# Patient Record
Sex: Male | Born: 1935 | Race: Black or African American | Hispanic: No | Marital: Married | Smoking: Former smoker
Health system: Southern US, Community
[De-identification: ages and names within clinical notes are randomized; demographics above are authoritative.]

## PROBLEM LIST (undated history)

## (undated) DIAGNOSIS — E119 Type 2 diabetes mellitus without complications: Secondary | ICD-10-CM

## (undated) DIAGNOSIS — F0390 Unspecified dementia without behavioral disturbance: Secondary | ICD-10-CM

## (undated) DIAGNOSIS — N390 Urinary tract infection, site not specified: Secondary | ICD-10-CM

## (undated) DIAGNOSIS — I10 Essential (primary) hypertension: Secondary | ICD-10-CM

## (undated) DIAGNOSIS — E785 Hyperlipidemia, unspecified: Secondary | ICD-10-CM

---

## 2001-01-25 ENCOUNTER — Ambulatory Visit (HOSPITAL_COMMUNITY): Admission: RE | Admit: 2001-01-25 | Discharge: 2001-01-25 | Payer: Self-pay | Admitting: General Surgery

## 2002-09-06 ENCOUNTER — Encounter: Payer: Self-pay | Admitting: Family Medicine

## 2002-09-06 ENCOUNTER — Ambulatory Visit (HOSPITAL_COMMUNITY): Admission: RE | Admit: 2002-09-06 | Discharge: 2002-09-06 | Payer: Self-pay | Admitting: Family Medicine

## 2002-09-13 ENCOUNTER — Encounter: Payer: Self-pay | Admitting: Family Medicine

## 2002-09-13 ENCOUNTER — Ambulatory Visit (HOSPITAL_COMMUNITY): Admission: RE | Admit: 2002-09-13 | Discharge: 2002-09-13 | Payer: Self-pay | Admitting: Family Medicine

## 2004-01-10 ENCOUNTER — Observation Stay (HOSPITAL_COMMUNITY): Admission: RE | Admit: 2004-01-10 | Discharge: 2004-01-11 | Payer: Self-pay | Admitting: General Surgery

## 2004-05-14 ENCOUNTER — Ambulatory Visit (HOSPITAL_COMMUNITY): Admission: RE | Admit: 2004-05-14 | Discharge: 2004-05-14 | Payer: Self-pay | Admitting: Internal Medicine

## 2005-07-08 ENCOUNTER — Ambulatory Visit (HOSPITAL_COMMUNITY): Admission: RE | Admit: 2005-07-08 | Discharge: 2005-07-08 | Payer: Self-pay | Admitting: Family Medicine

## 2006-05-18 ENCOUNTER — Ambulatory Visit (HOSPITAL_COMMUNITY): Admission: RE | Admit: 2006-05-18 | Discharge: 2006-05-18 | Payer: Self-pay | Admitting: Family Medicine

## 2008-08-30 ENCOUNTER — Inpatient Hospital Stay (HOSPITAL_COMMUNITY): Admission: EM | Admit: 2008-08-30 | Discharge: 2008-09-03 | Payer: Self-pay | Admitting: Emergency Medicine

## 2009-01-30 ENCOUNTER — Ambulatory Visit (HOSPITAL_COMMUNITY): Admission: RE | Admit: 2009-01-30 | Discharge: 2009-01-30 | Payer: Self-pay | Admitting: Family Medicine

## 2009-02-27 ENCOUNTER — Encounter: Admission: RE | Admit: 2009-02-27 | Discharge: 2009-02-27 | Payer: Self-pay | Admitting: Neurosurgery

## 2009-03-16 ENCOUNTER — Ambulatory Visit (HOSPITAL_COMMUNITY): Admission: RE | Admit: 2009-03-16 | Discharge: 2009-03-16 | Payer: Self-pay | Admitting: Family Medicine

## 2009-03-20 ENCOUNTER — Encounter: Admission: RE | Admit: 2009-03-20 | Discharge: 2009-03-20 | Payer: Self-pay | Admitting: Neurosurgery

## 2009-03-27 ENCOUNTER — Ambulatory Visit (HOSPITAL_COMMUNITY): Admission: RE | Admit: 2009-03-27 | Discharge: 2009-03-27 | Payer: Self-pay | Admitting: Family Medicine

## 2009-04-09 ENCOUNTER — Encounter (INDEPENDENT_AMBULATORY_CARE_PROVIDER_SITE_OTHER): Payer: Self-pay | Admitting: *Deleted

## 2009-05-03 DIAGNOSIS — I10 Essential (primary) hypertension: Secondary | ICD-10-CM

## 2009-05-03 DIAGNOSIS — N400 Enlarged prostate without lower urinary tract symptoms: Secondary | ICD-10-CM

## 2009-05-03 DIAGNOSIS — K5730 Diverticulosis of large intestine without perforation or abscess without bleeding: Secondary | ICD-10-CM | POA: Insufficient documentation

## 2009-05-03 DIAGNOSIS — E7800 Pure hypercholesterolemia, unspecified: Secondary | ICD-10-CM

## 2009-05-03 DIAGNOSIS — I6350 Cerebral infarction due to unspecified occlusion or stenosis of unspecified cerebral artery: Secondary | ICD-10-CM | POA: Insufficient documentation

## 2009-05-03 DIAGNOSIS — E119 Type 2 diabetes mellitus without complications: Secondary | ICD-10-CM

## 2009-05-07 ENCOUNTER — Telehealth (INDEPENDENT_AMBULATORY_CARE_PROVIDER_SITE_OTHER): Payer: Self-pay

## 2009-05-07 ENCOUNTER — Ambulatory Visit: Payer: Self-pay | Admitting: Internal Medicine

## 2009-05-07 DIAGNOSIS — R972 Elevated prostate specific antigen [PSA]: Secondary | ICD-10-CM | POA: Insufficient documentation

## 2009-05-07 DIAGNOSIS — R634 Abnormal weight loss: Secondary | ICD-10-CM

## 2009-05-11 ENCOUNTER — Encounter: Payer: Self-pay | Admitting: Internal Medicine

## 2009-05-14 ENCOUNTER — Encounter: Payer: Self-pay | Admitting: Urgent Care

## 2009-05-14 LAB — CONVERTED CEMR LAB: Tissue Transglutaminase Ab, IgA: 0.5 units (ref <7)

## 2009-05-15 ENCOUNTER — Ambulatory Visit: Payer: Self-pay | Admitting: Gastroenterology

## 2009-06-11 ENCOUNTER — Ambulatory Visit: Payer: Self-pay | Admitting: Internal Medicine

## 2009-06-12 ENCOUNTER — Encounter: Payer: Self-pay | Admitting: Urgent Care

## 2009-06-12 DIAGNOSIS — D649 Anemia, unspecified: Secondary | ICD-10-CM

## 2009-06-14 ENCOUNTER — Telehealth (INDEPENDENT_AMBULATORY_CARE_PROVIDER_SITE_OTHER): Payer: Self-pay

## 2009-06-14 LAB — CONVERTED CEMR LAB
Basophils Absolute: 0 10*3/uL (ref 0.0–0.1)
Basophils Relative: 0 % (ref 0–1)
Eosinophils Absolute: 0.4 10*3/uL (ref 0.0–0.7)
Eosinophils Relative: 5 % (ref 0–5)
Hemoglobin: 12.5 g/dL — ABNORMAL LOW (ref 13.0–17.0)
MCHC: 31.8 g/dL (ref 30.0–36.0)
Monocytes Absolute: 0.6 10*3/uL (ref 0.1–1.0)
Neutro Abs: 3.8 10*3/uL (ref 1.7–7.7)
RDW: 13.5 % (ref 11.5–15.5)

## 2009-06-18 ENCOUNTER — Encounter: Payer: Self-pay | Admitting: Internal Medicine

## 2009-06-25 ENCOUNTER — Ambulatory Visit: Payer: Self-pay | Admitting: Internal Medicine

## 2010-08-19 ENCOUNTER — Encounter: Payer: Self-pay | Admitting: Neurosurgery

## 2010-10-24 ENCOUNTER — Emergency Department (HOSPITAL_COMMUNITY): Payer: MEDICARE

## 2010-10-24 ENCOUNTER — Emergency Department (HOSPITAL_COMMUNITY)
Admission: EM | Admit: 2010-10-24 | Discharge: 2010-10-24 | Disposition: A | Discharge status: Home / Self Care | Payer: MEDICARE | Attending: Emergency Medicine | Admitting: Emergency Medicine

## 2010-10-24 DIAGNOSIS — I6990 Unspecified sequelae of unspecified cerebrovascular disease: Secondary | ICD-10-CM | POA: Insufficient documentation

## 2010-10-24 DIAGNOSIS — Z79899 Other long term (current) drug therapy: Secondary | ICD-10-CM | POA: Insufficient documentation

## 2010-10-24 DIAGNOSIS — R350 Frequency of micturition: Secondary | ICD-10-CM | POA: Insufficient documentation

## 2010-10-24 DIAGNOSIS — R509 Fever, unspecified: Secondary | ICD-10-CM | POA: Insufficient documentation

## 2010-10-24 DIAGNOSIS — N390 Urinary tract infection, site not specified: Secondary | ICD-10-CM | POA: Insufficient documentation

## 2010-10-24 DIAGNOSIS — R300 Dysuria: Secondary | ICD-10-CM | POA: Insufficient documentation

## 2010-10-24 LAB — DIFFERENTIAL
Basophils Relative: 0 % (ref 0–1)
Eosinophils Absolute: 0.1 10*3/uL (ref 0.0–0.7)
Eosinophils Relative: 1 % (ref 0–5)
Monocytes Absolute: 1.2 10*3/uL — ABNORMAL HIGH (ref 0.1–1.0)
Monocytes Relative: 10 % (ref 3–12)
Neutrophils Relative %: 80 % — ABNORMAL HIGH (ref 43–77)

## 2010-10-24 LAB — URINALYSIS, ROUTINE W REFLEX MICROSCOPIC
Bilirubin Urine: NEGATIVE
Nitrite: NEGATIVE
Protein, ur: 100 mg/dL — AB

## 2010-10-24 LAB — CBC
MCH: 28.6 pg (ref 26.0–34.0)
MCHC: 33.3 g/dL (ref 30.0–36.0)
Platelets: 232 10*3/uL (ref 150–400)

## 2010-10-24 LAB — COMPREHENSIVE METABOLIC PANEL
ALT: 15 U/L (ref 0–53)
Albumin: 3.8 g/dL (ref 3.5–5.2)
Alkaline Phosphatase: 65 U/L (ref 39–117)
BUN: 16 mg/dL (ref 6–23)
Chloride: 99 mEq/L (ref 96–112)
Glucose, Bld: 141 mg/dL — ABNORMAL HIGH (ref 70–99)
Potassium: 3.7 mEq/L (ref 3.5–5.1)
Sodium: 136 mEq/L (ref 135–145)
Total Bilirubin: 1 mg/dL (ref 0.3–1.2)

## 2010-10-24 LAB — URINE MICROSCOPIC-ADD ON

## 2010-10-27 LAB — URINE CULTURE

## 2010-11-12 LAB — CBC
HCT: 36.5 % — ABNORMAL LOW (ref 39.0–52.0)
HCT: 36.9 % — ABNORMAL LOW (ref 39.0–52.0)
HCT: 39.7 % (ref 39.0–52.0)
Hemoglobin: 12 g/dL — ABNORMAL LOW (ref 13.0–17.0)
Hemoglobin: 13.3 g/dL (ref 13.0–17.0)
MCHC: 33 g/dL (ref 30.0–36.0)
MCHC: 33.2 g/dL (ref 30.0–36.0)
MCV: 87.4 fL (ref 78.0–100.0)
MCV: 87.8 fL (ref 78.0–100.0)
MCV: 87.8 fL (ref 78.0–100.0)
Platelets: 189 10*3/uL (ref 150–400)
Platelets: 209 10*3/uL (ref 150–400)
Platelets: 233 10*3/uL (ref 150–400)
RBC: 4.17 MIL/uL — ABNORMAL LOW (ref 4.22–5.81)
RBC: 4.58 MIL/uL (ref 4.22–5.81)
RDW: 14.1 % (ref 11.5–15.5)
RDW: 14.2 % (ref 11.5–15.5)
RDW: 14.2 % (ref 11.5–15.5)
WBC: 20.3 10*3/uL — ABNORMAL HIGH (ref 4.0–10.5)
WBC: 20.6 10*3/uL — ABNORMAL HIGH (ref 4.0–10.5)
WBC: 25.4 10*3/uL — ABNORMAL HIGH (ref 4.0–10.5)

## 2010-11-12 LAB — GLUCOSE, CAPILLARY
Glucose-Capillary: 119 mg/dL — ABNORMAL HIGH (ref 70–99)
Glucose-Capillary: 121 mg/dL — ABNORMAL HIGH (ref 70–99)
Glucose-Capillary: 126 mg/dL — ABNORMAL HIGH (ref 70–99)
Glucose-Capillary: 127 mg/dL — ABNORMAL HIGH (ref 70–99)
Glucose-Capillary: 151 mg/dL — ABNORMAL HIGH (ref 70–99)
Glucose-Capillary: 153 mg/dL — ABNORMAL HIGH (ref 70–99)
Glucose-Capillary: 161 mg/dL — ABNORMAL HIGH (ref 70–99)
Glucose-Capillary: 170 mg/dL — ABNORMAL HIGH (ref 70–99)
Glucose-Capillary: 198 mg/dL — ABNORMAL HIGH (ref 70–99)

## 2010-11-12 LAB — DIFFERENTIAL
Basophils Absolute: 0 10*3/uL (ref 0.0–0.1)
Basophils Absolute: 0 10*3/uL (ref 0.0–0.1)
Basophils Absolute: 0 10*3/uL (ref 0.0–0.1)
Basophils Relative: 0 % (ref 0–1)
Basophils Relative: 0 % (ref 0–1)
Basophils Relative: 0 % (ref 0–1)
Eosinophils Absolute: 0 10*3/uL (ref 0.0–0.7)
Eosinophils Absolute: 0 10*3/uL (ref 0.0–0.7)
Eosinophils Relative: 0 % (ref 0–5)
Eosinophils Relative: 0 % (ref 0–5)
Eosinophils Relative: 0 % (ref 0–5)
Eosinophils Relative: 0 % (ref 0–5)
Lymphocytes Relative: 1 % — ABNORMAL LOW (ref 12–46)
Lymphocytes Relative: 3 % — ABNORMAL LOW (ref 12–46)
Lymphs Abs: 0.2 10*3/uL — ABNORMAL LOW (ref 0.7–4.0)
Lymphs Abs: 0.6 10*3/uL — ABNORMAL LOW (ref 0.7–4.0)
Lymphs Abs: 1 10*3/uL (ref 0.7–4.0)
Monocytes Absolute: 0.7 10*3/uL (ref 0.1–1.0)
Monocytes Absolute: 1 10*3/uL (ref 0.1–1.0)
Monocytes Absolute: 1.1 10*3/uL — ABNORMAL HIGH (ref 0.1–1.0)
Monocytes Relative: 4 % (ref 3–12)
Myelocytes: 0 %
Neutro Abs: 18.3 10*3/uL — ABNORMAL HIGH (ref 1.7–7.7)
Neutro Abs: 19.3 10*3/uL — ABNORMAL HIGH (ref 1.7–7.7)
Neutro Abs: 7 10*3/uL (ref 1.7–7.7)
Neutrophils Relative %: 71 % (ref 43–77)
Neutrophils Relative %: 89 % — ABNORMAL HIGH (ref 43–77)
Neutrophils Relative %: 94 % — ABNORMAL HIGH (ref 43–77)
Promyelocytes Absolute: 0 %
WBC Morphology: INCREASED
nRBC: 0 /100 WBC

## 2010-11-12 LAB — URINALYSIS, ROUTINE W REFLEX MICROSCOPIC
Bilirubin Urine: NEGATIVE
Glucose, UA: NEGATIVE mg/dL
Ketones, ur: NEGATIVE mg/dL
Nitrite: NEGATIVE
Specific Gravity, Urine: 1.01 (ref 1.005–1.030)
pH: 5.5 (ref 5.0–8.0)

## 2010-11-12 LAB — BASIC METABOLIC PANEL
BUN: 14 mg/dL (ref 6–23)
BUN: 15 mg/dL (ref 6–23)
BUN: 21 mg/dL (ref 6–23)
CO2: 25 mEq/L (ref 19–32)
CO2: 26 mEq/L (ref 19–32)
Calcium: 8.1 mg/dL — ABNORMAL LOW (ref 8.4–10.5)
Calcium: 8.2 mg/dL — ABNORMAL LOW (ref 8.4–10.5)
Calcium: 9.1 mg/dL (ref 8.4–10.5)
Chloride: 105 mEq/L (ref 96–112)
Chloride: 107 mEq/L (ref 96–112)
Chloride: 99 mEq/L (ref 96–112)
Creatinine, Ser: 1.14 mg/dL (ref 0.4–1.5)
Creatinine, Ser: 1.22 mg/dL (ref 0.4–1.5)
Creatinine, Ser: 1.26 mg/dL (ref 0.4–1.5)
Creatinine, Ser: 1.32 mg/dL (ref 0.4–1.5)
GFR calc Af Amer: >60 mL/min (ref >60)
GFR calc Af Amer: >60 mL/min (ref >60)
GFR calc Af Amer: >60 mL/min (ref >60)
GFR calc non Af Amer: >60 mL/min (ref >60)
Glucose, Bld: 112 mg/dL — ABNORMAL HIGH (ref 70–99)
Glucose, Bld: 142 mg/dL — ABNORMAL HIGH (ref 70–99)
Glucose, Bld: 211 mg/dL — ABNORMAL HIGH (ref 70–99)
Potassium: 3.4 mEq/L — ABNORMAL LOW (ref 3.5–5.1)
Potassium: 3.5 mEq/L (ref 3.5–5.1)
Potassium: 3.5 mEq/L (ref 3.5–5.1)
Sodium: 139 mEq/L (ref 135–145)

## 2010-11-12 LAB — URINE CULTURE
Colony Count: >=100000
Colony Count: >=100000
Special Requests: NEGATIVE

## 2010-11-12 LAB — URINE MICROSCOPIC-ADD ON

## 2010-11-12 LAB — DIGOXIN LEVEL: Digoxin Level: 0.3 ng/mL — ABNORMAL LOW (ref 0.8–2.0)

## 2010-11-12 LAB — TSH: TSH: 0.954 u[IU]/mL (ref 0.350–4.500)

## 2010-12-10 NOTE — Discharge Summary (Signed)
NAME:  Richard Hays, MACMASTER NO.:  [id number]   MEDICAL RECORD NO.:  [id number]          PATIENT TYPE:  INP   LOCATION:  A304                          FACILITY:  [facility]   PHYSICIAN:  Dorris Singh, DO    DATE OF BIRTH:  12-31-1925   DATE OF ADMISSION:  08/30/2008  DATE OF DISCHARGE:  02/07/2010LH                               DISCHARGE SUMMARY   ADMISSION DIAGNOSES:  1. Altered mental status.  2. Weakness.  3. Urinary tract infection.  4. Tachycardia.   DISCHARGE DIAGNOSES:  1. Altered mental status which is resolved.  2. Weakness which is improved.  3. Urinary tract infection which is resolving.  4. Tachycardia which is resolved.  5. Diabetes.  6. History of cerebrovascular accident.   HOSPITAL CONSULTATIONS:  None.   PRIMARY CARE PHYSICIAN:  Dr. Nobie Putnam.   His H and P was done by Dr. Lilian Kapur.  To summarize, the patient is an  77 year old African American male who presented with some mental status  changes and weakness.  He was found to have a UTI.  He was admitted to  the service of [facility].  He was started on IV Cipro.  Urine cultures  were obtained and the patient was placed on all of his home medications  and DVT and GI prophylaxis was initiated.  While the patient was in the  emergency room he did receive some IV Cardizem which improved his  tachycardia.  He was started on IV fluids as well.  While he was here he  also continued to have episodes of pyrexia which was treated with NSAIDs  but clinically he continued to improve.  On February 6 his urine  cultures grew out pseudomonas.  He was started initially on Cipro and  then Zosyn was added due to the patient's worsening condition as far as  multiple episodes of pyrexia.  Once his cultures came back which showed  Pseudomonas aeruginosa.  The Zosyn was discontinued.  His white count  continued to trend down and today the patient is stable and so will be  discharged home with continued p.o. antibiotic  therapy.  His vital signs  are as follows:   PHYSICAL EXAMINATION:  VITAL SIGNS:  Temperature 99, pulse 79,  respirations 20, blood pressure 140/77.  GENERAL:  The patient is an 77 year old African American male who is  well-developed and well-nourished and in no acute distress.  HEART:  Regular rate and rhythm.  CHEST:  Clear to auscultation bilaterally.  ABDOMEN:  Soft, nontender, nondistended.  EXTREMITIES:  Positive pulses.   White count is 9.8, hemoglobin 12.4, hematocrit 37.4, platelet count of  233.  Sodium 135, potassium 4.0, chloride 102, CO2 25, glucose 112, BUN  15, creatinine 1.14.   DISCHARGE MEDICATIONS:  1. Medications that he will go home on include fish oil daily.  2. Lipitor 40 mg p.o. daily.  3. Glucovance 5/500 twice a day.  4. Uroxatral 10 mg daily.  5. Aspirin 325 daily.  6. Digoxin 0.25 mg.  7. New medication will be Cipro 500 one p.o. x3 days and this will  give him a total course of 10 days of being on Cipro.   DISCHARGE INSTRUCTIONS:  He is to follow up with his primary care doctor  in 1-3 days.  He is to increase his activity ________.  He has no  restrictions on his diet.  However, he is to increase his diet slowly as  tolerated.  He is to continue on all medications as directed, encourage  fluids and if he does have episodes of fever he is to take NSAIDs as  needed.  The patient's condition is stable and his disposition is to  home.      Dorris Singh, DO  Electronically Signed     CB/MEDQ  D:  09/03/2008  T:  09/03/2008  Job:  [id number]   cc:   Dr Nobie Putnam

## 2010-12-10 NOTE — H&P (Signed)
NAME:  Richard Hays, Richard Hays NO.:  [id number]   MEDICAL RECORD NO.:  [id number]          PATIENT TYPE:  INP   LOCATION:  A304                          FACILITY:  [facility]   PHYSICIAN:  Skeet Latch, DO    DATE OF BIRTH:  06-09-1926   DATE OF ADMISSION:  08/30/2008  DATE OF DISCHARGE:  LH                              HISTORY & PHYSICAL   PRIMARY CARE PHYSICIAN:  Cres[place].   CHIEF COMPLAINT:  Altered mental status and weakness.   HISTORY OF PRESENT ILLNESS:  This is an 77 year old African-American  male who presents with some mild mental status changes and weakness.  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  He was working with a Radio broadcast assistant. His coworker  noticed he did not get out of his [employer] Niece they were riding in.  They went  back in to see if there were any problems, and the patient was confused  and had some incontinence of his urine.  They had difficulty getting him  back into the flower store, and EMS was called.  EMS arrived, noticed  the patient was confused and had unsteady gait.  The patient stated he  did not feel well, and according to family members he had not been  feeling well over the last few days.  Wife states he has a poor  appetite.  No fever, chills, abdominal pain, chest pain, nausea, or  vomiting.  The patient presented to the ER. He was slightly  tachycardiac.  He has been receiving fluid boluses also.  The patient  was found to have a urinary tract infection and was given and  antibiotics and at this time feels better and seems to be improving.   PAST MEDICAL HISTORY:  Includes CVA and TIAs and irregular heartbeat   SURGICAL HISTORY:  Hernia.   SOCIAL HISTORY:  No history of smoking, alcoholism, or illicit drug use.   FAMILY HISTORY:  Unremarkable.   ALLERGIES:  No known drug allergies.   HOME MEDICATIONS:  Include Glucovance 5/500 mg twice daily, Uroxatral 10  mg once a day, aspirin 325 mg  daily, digoxin 0.25 mg once a day, Lipitor  40 mg 1 tablet p.o. daily.   REVIEW OF SYSTEMS:  CONSTITUTIONAL:  Decreased appetite. No fever,  chills, weight gain, weight loss.  HEENT: Unremarkable.  CARDIOVASCULAR:  Unremarkable.  RESPIRATORY:  Unremarkable.  GASTROINTESTINAL:  Unremarkable.  GENITOURINARY:  Unremarkable.  MUSCULOSKELETAL:  Positive  for some myalgias.  NEUROLOGIC:  Positive for some right leg weakness.   PHYSICAL EXAM:  VITAL SIGNS: Temperature is 99.6, respiratory rate 36,  pulse 84, blood pressure 127/66.  CONSTITUTIONAL: He is well developed, well nourished, well hydrated, in  no acute distress.  He appears current age.  HEENT: Head is atraumatic, normocephalic.  Eyes: PERRLA.  EOMI.  NECK: Soft, supple, nontender, nondistended.  CARDIOVASCULAR:  Positive for murmur. No rubs, gallops.  Regular rate  and rhythm.  RESPIRATORY:  Lungs are clear to auscultation bilaterally.  No rales,  rhonchi or wheezing.  ABDOMEN: Soft, nontender, nondistended.  Positive  bowel sounds.  No  rigidity or guarding.  EXTREMITIES: No clubbing, cyanosis or edema.  NEUROLOGIC:  Cranial nerves II-XII are grossly intact.  The patient  moves all extremities.  He does have some weakness on the right lower  extremity.  SKIN:  Color is normal.  No petechiae, no rash or moles.  PSYCHIATRIC:  Normal mood.  Normal affect.   LABS:  Microscopic urine few bacteria, WBCs too numerous to count, RBCs,  3-6.  Urinalysis hazy in appearance, moderate hemoglobin, trace protein,  negative nitrites, small leukocyte esterase.  Sodium 139, potassium 3.5,  chloride 103, CO2 of 24, glucose 142, BUN 18, creatinine 1.08, calcium  9.1.  White count 20,300, hemoglobin 13.3, hematocrit 39.7, platelet  count 224,000.  Chest x-ray: No acute or significant findings.   ASSESSMENT:  This is an 77 year old African-American male who presented  with some mental status changes and weakness.  The patient was found to  have a  urinary tract infection upon being evaluated in the emergency  room.  The patient is also tachycardiac and is receiving IV fluid  boluses and seems to be improving.   PLAN:  1. The patient was admitted to the service of [facility] to telemetry      unit.  2. For his urinary tract infection the patient will placed on IV      antibiotics.  Will get some accurate I's and O's.  Will continue the patient with IV fluid hydration at this time.  1. For his tachycardia  the patient did receive some IV Cardizem in      the emergency room and this seems to be improving his tachycardia.      The patient will be kept on his digoxin current dose. Will get a      digoxin level in the a.m.  2. For his diabetes the patient will placed on his home Glucovance.      Will get a hemoglobin A1c with his morning labs.  The patient will      be  placed on sliding scale, and his blood sugars will be checked      a.c. and h.s.  3. The patient will be placed on DVT as well as GI prophylaxis.      Anticipate the patient being discharged within the next 24-48 hours      if he continues to improve.      Skeet Latch, DO  Electronically Signed     SM/MEDQ  D:  08/30/2008  T:  08/31/2008  Job:  [id number]   cc:   Patrica Duel, M.D.  Fax: [phone]

## 2010-12-13 NOTE — Op Note (Signed)
NAME:  Richard Hays, Richard Hays NO.:  [id number]   MEDICAL RECORD NO.:  [id number]                   PATIENT TYPE:  AMB   LOCATION:  DAY                                  FACILITY:  [facility]   PHYSICIAN:  Barbaraann Barthel, M.D.              DATE OF BIRTH:  14-Oct-1925   DATE OF PROCEDURE:  01/10/2004  DATE OF DISCHARGE:                                 OPERATIVE REPORT   PREOPERATIVE DIAGNOSIS:  Left inguinal hernia.   POSTOPERATIVE DIAGNOSIS:  Left inguinal hernia.   OPERATION PERFORMED:  Left inguinal herniorrhaphy (modified McVay repair).   SURGEON:  Barbaraann Barthel, M.D.   SPECIMENS:  1. Properitoneal lipomatous tissue.  2. Hernia sac.   ANESTHESIA:   INDICATIONS FOR PROCEDURE:  The patient is a 77 year old black male who  presented to the medical service with signs and symptoms of left inguinal  hernia and was referred for surgery.  In essence, he had a palpable left  inguinal mass consistent with a nonincarcerated moderate sized left inguinal  hernia.  We discussed elective procedure with him, discussed the possible  use of mesh and discussed complications not limited to but including  bleeding, infection and recurrence.  Informed consent was obtained.   OPERATIVE FINDINGS:  The patient had a fairly scarred up hernia sac and a  moderate-sized indirect hernia and some weakness of the inguinal floor.  No  other abnormalities were encountered.   DESCRIPTION OF PROCEDURE:  The patient was placed in supine position after  the adequate administration of spinal anesthesia, a Foley catheter was  aseptically inserted.  The patient was prepped with Betadine solution and  draped in the usual manner.  Incision was carried out between the anterior  superior iliac spine and the pubic tubercle through skin and subcutaneous  tissue down through Scarpa's layer, opening the external oblique through the  external ring.  The ilioinguinal nerve was preserved from  dissection.  The  cord structures were dissected free from the hernia sac.  The hernia sac was  doubly ligated under direct vision.  Lipomatous properitoneal fat was also  clamped, ligated and removed and after this was done, we then sutured the  transversus abdominis and transversalis fascia to Cooper's ligament and  Poupart's ligament using interrupted 2-0 Bralon sutures.  A relaxing  incision was carried out prior to cinching these tightly.  Approximately 9  mL of 0.5% Sensorcaine was used to help with postoperative comfort around  the operative site.  We then irrigated with normal saline solution, returned  the cord structures and the ilioinguinal nerve to its anatomical position  and then closed the external oblique over the cord structures with a  running 3-0 Polysorb suture.  The subcutaneous was irrigated.  The skin was  closed with a stapling device.  Prior to closure all sponge, needle and  instrument counts were found to be correct.  The estimated blood loss  was  minimal.  The patient received 1200 mL of crystalloids intraoperatively.  There were no complications.      ___________________________________________                                            Barbaraann Barthel, M.D.   WB/MEDQ  D:  01/10/2004  T:  01/10/2004  Job:  [id number]   cc:   Patrica Duel, M.D.  [address]  [place]  [place] [address]  Fax: [phone]

## 2010-12-13 NOTE — Op Note (Signed)
NAME:  Richard Hays, PICOU NO.:  [id number]   MEDICAL RECORD NO.:  [id number]          PATIENT TYPE:  OBV   LOCATION:  A320                          FACILITY:  [facility]   PHYSICIAN:  R. Roetta Sessions, M.D. DATE OF BIRTH:  1926/04/11   DATE OF PROCEDURE:  05/14/2004  DATE OF DISCHARGE:  01/11/2004                                 OPERATIVE REPORT   PROCEDURE:  Diagnostic colonoscopy.   INDICATIONS:  The patient is a 77 year old gentleman with recent weight  loss.  He is devoid of any GI tract symptoms.  He was found to be Hemoccult-  positive recently.  Colonoscopy is now being done.  This approach has been  discussed with the patient at length.  The potential risks, benefits, and  alternatives have been reviewed, questions answered.  Please see my dictated  office note from April 19, 2004.  I believe he is low-risk for conscious  sedation.  Airway assessed.   PROCEDURE NOTE:  O2 saturation, blood pressure, pulse, and respirations were  monitored throughout the entire procedure.   CONSCIOUS SEDATION:  Versed 3 mg IV, Demerol 75 mg IV in divided doses.   INSTRUMENT USED:  Olympus video chip system.   FINDINGS:  Digital rectal exam revealed no abnormalities.   ENDOSCOPIC FINDINGS:  The prep was marginal.   Rectum:  Examination of the rectal mucosa including retroflexed view of the  anal verge revealed no abnormalities.   Colon:  The colonic mucosa was surveyed from the rectosigmoid junction  through the left, transverse, right colon to the area of the appendiceal  orifice and ileocecal valve.  These structures were well-seen and  photographed for the record.  From this level the scope was slowly  withdrawn.  All previously-mentioned mucosal surfaces were again seen.  The  patient had a few scattered left-sided diverticula.  The remainder of the  colonic mucosa appeared normal.  There was a thin coating of tenacious stool  throughout the lining of the colon, which  had to be washed copiously to get  adequate visualization.  A very small polyp or AVM may have not been seen  today because of the marginal prep; however, no gross polyp or neoplastic  process was seen.  The patient tolerated the procedure well, was reacted in  endoscopy.   IMPRESSION:  1.  Normal rectum.  2.  A few scattered left-sided diverticula.  3.  The remainder of the colonic mucosa appeared normal.  Marginal prep made      the exam more difficult, though these findings are reassuring.   RECOMMENDATIONS:  1.  CBC today.  2.  Will see the patient back in four weeks for weight check and see how he      is doing and go from there.     Otelia Sergeant   RMR/MEDQ  D:  05/14/2004  T:  05/14/2004  Job:  [id number]   cc:   Patrica Duel, M.D.  [address]  [place]  [place] [address]  Fax: [phone]

## 2013-12-27 ENCOUNTER — Encounter: Payer: Self-pay | Admitting: Internal Medicine

## 2014-03-20 ENCOUNTER — Emergency Department (HOSPITAL_COMMUNITY): Payer: Medicare Other

## 2014-03-20 ENCOUNTER — Encounter (HOSPITAL_COMMUNITY): Payer: Self-pay | Admitting: Emergency Medicine

## 2014-03-20 ENCOUNTER — Emergency Department (HOSPITAL_COMMUNITY)
Admission: EM | Admit: 2014-03-20 | Discharge: 2014-03-20 | Disposition: A | Discharge status: Home / Self Care | Payer: Medicare Other | Attending: Emergency Medicine | Admitting: Emergency Medicine

## 2014-03-20 DIAGNOSIS — R4182 Altered mental status, unspecified: Secondary | ICD-10-CM | POA: Diagnosis present

## 2014-03-20 DIAGNOSIS — F0390 Unspecified dementia without behavioral disturbance: Secondary | ICD-10-CM | POA: Diagnosis not present

## 2014-03-20 DIAGNOSIS — Z7982 Long term (current) use of aspirin: Secondary | ICD-10-CM | POA: Diagnosis not present

## 2014-03-20 DIAGNOSIS — I10 Essential (primary) hypertension: Secondary | ICD-10-CM | POA: Insufficient documentation

## 2014-03-20 DIAGNOSIS — F29 Unspecified psychosis not due to a substance or known physiological condition: Secondary | ICD-10-CM | POA: Insufficient documentation

## 2014-03-20 DIAGNOSIS — E119 Type 2 diabetes mellitus without complications: Secondary | ICD-10-CM | POA: Insufficient documentation

## 2014-03-20 DIAGNOSIS — N390 Urinary tract infection, site not specified: Secondary | ICD-10-CM | POA: Diagnosis not present

## 2014-03-20 DIAGNOSIS — Z87891 Personal history of nicotine dependence: Secondary | ICD-10-CM | POA: Diagnosis not present

## 2014-03-20 DIAGNOSIS — Z79899 Other long term (current) drug therapy: Secondary | ICD-10-CM | POA: Insufficient documentation

## 2014-03-20 HISTORY — DX: Unspecified dementia without behavioral disturbance: F03.90

## 2014-03-20 HISTORY — DX: Type 2 diabetes mellitus without complications: E11.9

## 2014-03-20 HISTORY — DX: Urinary tract infection, site not specified: N39.0

## 2014-03-20 HISTORY — DX: Essential (primary) hypertension: I10

## 2014-03-20 LAB — URINALYSIS, ROUTINE W REFLEX MICROSCOPIC
BILIRUBIN URINE: NEGATIVE
Glucose, UA: NEGATIVE mg/dL
Hgb urine dipstick: NEGATIVE
Ketones, ur: NEGATIVE mg/dL
Leukocytes, UA: NEGATIVE
NITRITE: NEGATIVE
Protein, ur: 30 mg/dL — AB
Specific Gravity, Urine: 1.025 (ref 1.005–1.030)
Urobilinogen, UA: 0.2 mg/dL (ref 0.0–1.0)
pH: 5.5 (ref 5.0–8.0)

## 2014-03-20 LAB — CBC WITH DIFFERENTIAL/PLATELET
Basophils Absolute: 0 10*3/uL (ref 0.0–0.1)
Basophils Relative: 0 % (ref 0–1)
Eosinophils Absolute: 0.8 10*3/uL — ABNORMAL HIGH (ref 0.0–0.7)
Eosinophils Relative: 12 % — ABNORMAL HIGH (ref 0–5)
HCT: 40.2 % (ref 39.0–52.0)
HEMOGLOBIN: 13.3 g/dL (ref 13.0–17.0)
LYMPHS ABS: 1.8 10*3/uL (ref 0.7–4.0)
LYMPHS PCT: 26 % (ref 12–46)
MCH: 27.9 pg (ref 26.0–34.0)
MCHC: 33.1 g/dL (ref 30.0–36.0)
MCV: 84.5 fL (ref 78.0–100.0)
MONOS PCT: 8 % (ref 3–12)
Monocytes Absolute: 0.6 10*3/uL (ref 0.1–1.0)
NEUTROS ABS: 3.7 10*3/uL (ref 1.7–7.7)
NEUTROS PCT: 54 % (ref 43–77)
Platelets: 219 10*3/uL (ref 150–400)
RBC: 4.76 MIL/uL (ref 4.22–5.81)
RDW: 14.1 % (ref 11.5–15.5)
WBC: 6.9 10*3/uL (ref 4.0–10.5)

## 2014-03-20 LAB — COMPREHENSIVE METABOLIC PANEL
ALK PHOS: 88 U/L (ref 39–117)
ALT: 16 U/L (ref 0–53)
ANION GAP: 14 (ref 5–15)
AST: 22 U/L (ref 0–37)
Albumin: 4.1 g/dL (ref 3.5–5.2)
BUN: 24 mg/dL — AB (ref 6–23)
CHLORIDE: 106 meq/L (ref 96–112)
CO2: 23 meq/L (ref 19–32)
Calcium: 9.3 mg/dL (ref 8.4–10.5)
Creatinine, Ser: 1.47 mg/dL — ABNORMAL HIGH (ref 0.50–1.35)
GFR, EST AFRICAN AMERICAN: 48 mL/min — AB (ref >90)
GFR, EST NON AFRICAN AMERICAN: 41 mL/min — AB (ref >90)
GLUCOSE: 128 mg/dL — AB (ref 70–99)
POTASSIUM: 4.6 meq/L (ref 3.7–5.3)
Sodium: 143 mEq/L (ref 137–147)
Total Bilirubin: 0.4 mg/dL (ref 0.3–1.2)
Total Protein: 7.9 g/dL (ref 6.0–8.3)

## 2014-03-20 LAB — URINE MICROSCOPIC-ADD ON

## 2014-03-20 MED ORDER — CIPROFLOXACIN HCL 250 MG PO TABS
500.0000 mg | ORAL_TABLET | Freq: Once | ORAL | Status: AC
  Administered 2014-03-20: 500 mg via ORAL
  Filled 2014-03-20: qty 2

## 2014-03-20 MED ORDER — CIPROFLOXACIN HCL 500 MG PO TABS: 500.0000 mg | ORAL_TABLET | Freq: Two times a day (BID) | ORAL | Status: DC

## 2014-03-20 NOTE — Discharge Instructions (Signed)
Follow up with your md in 3-4 days °

## 2014-03-20 NOTE — ED Provider Notes (Signed)
CSN: [id number]     Arrival date & time 03/20/14  1658 History   First MD Initiated Contact with Patient 03/20/14 1832     Chief Complaint  Patient presents with  . Altered Mental Status     (Consider location/radiation/quality/duration/timing/severity/associated sxs/prior Treatment) Patient is a 80 y.o. male presenting with altered mental status. The history is provided by the patient (pt has had some confusion lately and the family states usually this is from a uti).  Altered Mental Status Presenting symptoms: confusion   Severity:  Mild Most recent episode:  2 days ago Timing:  Constant Progression:  Unchanged Chronicity:  Recurrent Context: dementia   Associated symptoms: no abdominal pain, no hallucinations, no headaches, no rash and no seizures     Past Medical History  Diagnosis Date  . Dementia   . Diabetes mellitus without complication   . Hypertension   . UTI (lower urinary tract infection)    History reviewed. No pertinent past surgical history. History reviewed. No pertinent family history. History  Substance Use Topics  . Smoking status: Former Games developer  . Smokeless tobacco: Not on file  . Alcohol Use: No    Review of Systems  Constitutional: Negative for appetite change and fatigue.  HENT: Negative for congestion, ear discharge and sinus pressure.   Eyes: Negative for discharge.  Respiratory: Negative for cough.   Cardiovascular: Negative for chest pain.  Gastrointestinal: Negative for abdominal pain and diarrhea.  Genitourinary: Negative for frequency and hematuria.  Musculoskeletal: Negative for back pain.  Skin: Negative for rash.  Neurological: Negative for seizures and headaches.  Psychiatric/Behavioral: Positive for confusion. Negative for hallucinations.      Allergies  Review of patient's allergies indicates no known allergies.  Home Medications   Prior to Admission medications   Medication Sig Start Date End Date Taking? Authorizing  Provider  aspirin EC 325 MG tablet Take 325 mg by mouth every morning.   Yes Historical Provider, MD  atorvastatin (LIPITOR) 40 MG tablet Take 40 mg by mouth at bedtime. 02/18/14  Yes Historical Provider, MD  DIGOX 250 MCG tablet Take 250 mcg by mouth daily. 02/18/14  Yes Historical Provider, MD  donepezil (ARICEPT) 10 MG tablet Take 10 mg by mouth at bedtime. 02/18/14  Yes Historical Provider, MD  lisinopril (PRINIVIL,ZESTRIL) 5 MG tablet Take 5 mg by mouth daily. 03/02/14  Yes Historical Provider, MD  metFORMIN (GLUCOPHAGE) 500 MG tablet Take 250 mg by mouth 2 (two) times daily.   Yes Historical Provider, MD  NAMENDA 5 MG tablet Take 5 mg by mouth 2 (two) times daily. 01/25/14  Yes Historical Provider, MD  ciprofloxacin (CIPRO) 500 MG tablet Take 1 tablet (500 mg total) by mouth 2 (two) times daily. One po bid x 7 days 03/20/14   Benny Lennert, MD   BP 151/90  Pulse 91  Temp(Src) 99.4 F (37.4 C) (Oral)  Resp 18  Ht  (1.702 m)  Wt 142 lb (64.411 kg)  BMI 22.24 kg/m2  SpO2 100% Physical Exam  Constitutional: He is oriented to person, place, and time. He appears well-developed.  HENT:  Head: Normocephalic.  Eyes: Conjunctivae and EOM are normal. No scleral icterus.  Neck: Neck supple. No thyromegaly present.  Cardiovascular: Normal rate and regular rhythm.  Exam reveals no gallop and no friction rub.   No murmur heard. Pulmonary/Chest: No stridor. He has no wheezes. He has no rales. He exhibits no tenderness.  Abdominal: He exhibits no distension. There is no tenderness.  There is no rebound.  Musculoskeletal: Normal range of motion. He exhibits no edema.  Lymphadenopathy:    He has no cervical adenopathy.  Neurological: He is oriented to person, place, and time. He exhibits normal muscle tone. Coordination normal.  Skin: No rash noted. No erythema.  Psychiatric: He has a normal mood and affect. His behavior is normal.    ED Course  Procedures (including critical care time) Labs  Review Labs Reviewed  CBC WITH DIFFERENTIAL - Abnormal; Notable for the following:    Eosinophils Relative 12 (*)    Eosinophils Absolute 0.8 (*)    All other components within normal limits  COMPREHENSIVE METABOLIC PANEL - Abnormal; Notable for the following:    Glucose, Bld 128 (*)    BUN 24 (*)    Creatinine, Ser 1.47 (*)    GFR calc non Af Amer 41 (*)    GFR calc Af Amer 48 (*)    All other components within normal limits  URINALYSIS, ROUTINE W REFLEX MICROSCOPIC - Abnormal; Notable for the following:    Protein, ur 30 (*)    All other components within normal limits  URINE MICROSCOPIC-ADD ON - Abnormal; Notable for the following:    Squamous Epithelial / LPF FEW (*)    All other components within normal limits    Imaging Review Ct Head Wo Contrast  03/20/2014   CLINICAL DATA:  Incontinence for 1 day, confusion  EXAM: CT HEAD WITHOUT CONTRAST  TECHNIQUE: Contiguous axial images were obtained from the base of the skull through the vertex without intravenous contrast.  COMPARISON:  None.  FINDINGS: Moderate diffuse age-related atrophy. Mild low attenuation in the deep white matter. No evidence of focal abnormality to suggest mass hemorrhage infarct or extra-axial fluid. No hydrocephalus. Calvarium is intact.  IMPRESSION: No acute findings.   Electronically Signed   By: Esperanza Heir M.D.   On: 03/20/2014 20:39   Dg Chest Portable 1 View  03/20/2014   CLINICAL DATA:  Altered mental status.  Ex-smoker.  Hypertension.  EXAM: PORTABLE CHEST - 1 VIEW  COMPARISON:  10/24/2010.  FINDINGS: The cardiac silhouette remains borderline enlarged. Clear lungs with normal vascularity. Bilateral AC joint degenerative changes. Thoracic spine degenerative changes.  IMPRESSION: No acute abnormality.  Stable borderline cardiomegaly.   Electronically Signed   By: Gordan Payment M.D.   On: 03/20/2014 19:07     EKG Interpretation None      MDM   Final diagnoses:  UTI (lower urinary tract infection)     uti    Benny Lennert, MD 03/20/14 2114

## 2014-03-20 NOTE — ED Notes (Signed)
Patient given discharge instruction, verbalized understand. IV removed, band aid applied. Patient in wheelchair out of the department.  

## 2014-03-20 NOTE — ED Notes (Signed)
Pt with incont of urine x 2 today, per wife pt has been confused last night

## 2014-06-05 ENCOUNTER — Other Ambulatory Visit (HOSPITAL_COMMUNITY): Payer: Self-pay | Admitting: Family Medicine

## 2014-06-05 ENCOUNTER — Ambulatory Visit (HOSPITAL_COMMUNITY)
Admission: RE | Admit: 2014-06-05 | Discharge: 2014-06-05 | Disposition: A | Discharge status: Home / Self Care | Payer: Medicare Other | Source: Ambulatory Visit | Attending: Family Medicine | Admitting: Family Medicine

## 2014-06-05 DIAGNOSIS — E119 Type 2 diabetes mellitus without complications: Secondary | ICD-10-CM | POA: Diagnosis not present

## 2014-06-05 DIAGNOSIS — R059 Cough, unspecified: Secondary | ICD-10-CM

## 2014-06-05 DIAGNOSIS — Z8673 Personal history of transient ischemic attack (TIA), and cerebral infarction without residual deficits: Secondary | ICD-10-CM | POA: Diagnosis not present

## 2014-06-05 DIAGNOSIS — R05 Cough: Secondary | ICD-10-CM | POA: Insufficient documentation

## 2014-06-05 DIAGNOSIS — Z87891 Personal history of nicotine dependence: Secondary | ICD-10-CM | POA: Diagnosis not present

## 2014-06-05 DIAGNOSIS — I10 Essential (primary) hypertension: Secondary | ICD-10-CM | POA: Diagnosis not present

## 2014-06-20 ENCOUNTER — Inpatient Hospital Stay (HOSPITAL_COMMUNITY)
Admission: EM | Admit: 2014-06-20 | Discharge: 2014-06-23 | DRG: 872 | Disposition: A | Discharge status: Home / Self Care | Payer: Medicare Other | Attending: Internal Medicine | Admitting: Internal Medicine

## 2014-06-20 ENCOUNTER — Encounter (HOSPITAL_COMMUNITY): Payer: Self-pay

## 2014-06-20 ENCOUNTER — Emergency Department (HOSPITAL_COMMUNITY): Payer: Medicare Other

## 2014-06-20 DIAGNOSIS — F0390 Unspecified dementia without behavioral disturbance: Secondary | ICD-10-CM | POA: Diagnosis present

## 2014-06-20 DIAGNOSIS — Z87891 Personal history of nicotine dependence: Secondary | ICD-10-CM

## 2014-06-20 DIAGNOSIS — E119 Type 2 diabetes mellitus without complications: Secondary | ICD-10-CM | POA: Diagnosis present

## 2014-06-20 DIAGNOSIS — Z7982 Long term (current) use of aspirin: Secondary | ICD-10-CM

## 2014-06-20 DIAGNOSIS — N183 Chronic kidney disease, stage 3 (moderate): Secondary | ICD-10-CM | POA: Diagnosis present

## 2014-06-20 DIAGNOSIS — I10 Essential (primary) hypertension: Secondary | ICD-10-CM | POA: Diagnosis present

## 2014-06-20 DIAGNOSIS — B9620 Unspecified Escherichia coli [E. coli] as the cause of diseases classified elsewhere: Secondary | ICD-10-CM | POA: Diagnosis present

## 2014-06-20 DIAGNOSIS — R509 Fever, unspecified: Secondary | ICD-10-CM

## 2014-06-20 DIAGNOSIS — IMO0001 Reserved for inherently not codable concepts without codable children: Secondary | ICD-10-CM | POA: Insufficient documentation

## 2014-06-20 DIAGNOSIS — I129 Hypertensive chronic kidney disease with stage 1 through stage 4 chronic kidney disease, or unspecified chronic kidney disease: Secondary | ICD-10-CM | POA: Diagnosis present

## 2014-06-20 DIAGNOSIS — B001 Herpesviral vesicular dermatitis: Secondary | ICD-10-CM | POA: Diagnosis present

## 2014-06-20 DIAGNOSIS — N400 Enlarged prostate without lower urinary tract symptoms: Secondary | ICD-10-CM | POA: Diagnosis present

## 2014-06-20 DIAGNOSIS — A419 Sepsis, unspecified organism: Secondary | ICD-10-CM | POA: Diagnosis present

## 2014-06-20 DIAGNOSIS — N390 Urinary tract infection, site not specified: Secondary | ICD-10-CM | POA: Diagnosis present

## 2014-06-20 DIAGNOSIS — K529 Noninfective gastroenteritis and colitis, unspecified: Secondary | ICD-10-CM | POA: Diagnosis present

## 2014-06-20 LAB — URINALYSIS, ROUTINE W REFLEX MICROSCOPIC
Bilirubin Urine: NEGATIVE
GLUCOSE, UA: NEGATIVE mg/dL
Ketones, ur: NEGATIVE mg/dL
Nitrite: NEGATIVE
PH: 5.5 (ref 5.0–8.0)
Protein, ur: 30 mg/dL — AB
Specific Gravity, Urine: 1.015 (ref 1.005–1.030)
Urobilinogen, UA: 0.2 mg/dL (ref 0.0–1.0)

## 2014-06-20 LAB — COMPREHENSIVE METABOLIC PANEL
ALBUMIN: 3.3 g/dL — AB (ref 3.5–5.2)
ALT: 13 U/L (ref 0–53)
AST: 15 U/L (ref 0–37)
Alkaline Phosphatase: 103 U/L (ref 39–117)
Anion gap: 19 — ABNORMAL HIGH (ref 5–15)
BUN: 36 mg/dL — AB (ref 6–23)
CHLORIDE: 105 meq/L (ref 96–112)
CO2: 15 mEq/L — ABNORMAL LOW (ref 19–32)
CREATININE: 1.53 mg/dL — AB (ref 0.50–1.35)
Calcium: 8.6 mg/dL (ref 8.4–10.5)
GFR calc Af Amer: 46 mL/min — ABNORMAL LOW (ref >90)
GFR calc non Af Amer: 39 mL/min — ABNORMAL LOW (ref >90)
GLUCOSE: 133 mg/dL — AB (ref 70–99)
POTASSIUM: 3.5 meq/L — AB (ref 3.7–5.3)
Sodium: 139 mEq/L (ref 137–147)
Total Bilirubin: 0.3 mg/dL (ref 0.3–1.2)
Total Protein: 7.5 g/dL (ref 6.0–8.3)

## 2014-06-20 LAB — CBC WITH DIFFERENTIAL/PLATELET
Basophils Absolute: 0 10*3/uL (ref 0.0–0.1)
Basophils Relative: 0 % (ref 0–1)
Eosinophils Absolute: 0.1 10*3/uL (ref 0.0–0.7)
Eosinophils Relative: 1 % (ref 0–5)
HCT: 38.3 % — ABNORMAL LOW (ref 39.0–52.0)
HEMOGLOBIN: 13.1 g/dL (ref 13.0–17.0)
LYMPHS ABS: 0.3 10*3/uL — AB (ref 0.7–4.0)
Lymphocytes Relative: 3 % — ABNORMAL LOW (ref 12–46)
MCH: 28.2 pg (ref 26.0–34.0)
MCHC: 34.2 g/dL (ref 30.0–36.0)
MCV: 82.5 fL (ref 78.0–100.0)
MONOS PCT: 0 % — AB (ref 3–12)
Monocytes Absolute: 0 10*3/uL — ABNORMAL LOW (ref 0.1–1.0)
NEUTROS ABS: 8.6 10*3/uL — AB (ref 1.7–7.7)
Neutrophils Relative %: 96 % — ABNORMAL HIGH (ref 43–77)
Platelets: 323 10*3/uL (ref 150–400)
RBC: 4.64 MIL/uL (ref 4.22–5.81)
RDW: 14.4 % (ref 11.5–15.5)
WBC: 9.1 10*3/uL (ref 4.0–10.5)

## 2014-06-20 LAB — URINE MICROSCOPIC-ADD ON

## 2014-06-20 LAB — LACTIC ACID, PLASMA: Lactic Acid, Venous: 2.4 mmol/L — ABNORMAL HIGH (ref 0.5–2.2)

## 2014-06-20 MED ORDER — ACETAMINOPHEN 500 MG PO TABS: 1000.0000 mg | ORAL_TABLET | Freq: Once | ORAL | Status: DC

## 2014-06-20 MED ORDER — DEXTROSE 5 % IV SOLN: 1.0000 g | Freq: Once | INTRAVENOUS | Status: DC

## 2014-06-20 MED ORDER — SODIUM CHLORIDE 0.9 % IV BOLUS (SEPSIS)
500.0000 mL | Freq: Once | INTRAVENOUS | Status: AC
  Administered 2014-06-20: 500 mL via INTRAVENOUS

## 2014-06-20 MED ORDER — PIPERACILLIN-TAZOBACTAM 3.375 G IVPB 30 MIN
3.3750 g | Freq: Once | INTRAVENOUS | Status: AC
  Administered 2014-06-20: 3.375 g via INTRAVENOUS
  Filled 2014-06-20: qty 50

## 2014-06-20 MED ORDER — ACETAMINOPHEN 650 MG RE SUPP
650.0000 mg | Freq: Once | RECTAL | Status: AC
  Administered 2014-06-20: 650 mg via RECTAL
  Filled 2014-06-20: qty 1

## 2014-06-20 NOTE — ED Provider Notes (Signed)
CSN: [id number]     Arrival date & time 06/20/14  2000 History   First MD Initiated Contact with Patient 06/20/14 2010     Chief Complaint  Patient presents with  . Fever     (Consider location/radiation/quality/duration/timing/severity/associated sxs/prior Treatment) HPI  This is an 80 yo male with a history of dementia, diabetes, hypertension, UTIs who presents with fever. Per the patient's wife, he's been feeling generally ill and weak over the last week. At 5 PM today he had chills and felt cold.  She states that he got up from his chair and had one episode of emesis and fecal incontinence. No blood noted in the stool. Patient is oriented 2 but largely noncontributory to history taking. Per the wife, he has a history of UTIs.    Level V caveat for dementia  Past Medical History  Diagnosis Date  . Dementia   . Diabetes mellitus without complication   . Hypertension   . UTI (lower urinary tract infection)    History reviewed. No pertinent past surgical history. No family history on file. History  Substance Use Topics  . Smoking status: Former Games developer  . Smokeless tobacco: Not on file  . Alcohol Use: No    Review of Systems  Unable to perform ROS: Dementia  Constitutional: Positive for fever, chills and fatigue.  Respiratory: Negative.  Negative for cough.   Cardiovascular: Negative.   Gastrointestinal: Positive for nausea, vomiting and diarrhea.  Genitourinary: Negative.       Allergies  Review of patient's allergies indicates no known allergies.  Home Medications   Prior to Admission medications   Medication Sig Start Date End Date Taking? Authorizing Provider  ALPRAZolam (XANAX) 0.25 MG tablet Take 0.25 mg by mouth 3 (three) times daily as needed for anxiety.   Yes Historical Provider, MD  aspirin EC 325 MG tablet Take 325 mg by mouth every morning.   Yes Historical Provider, MD  atorvastatin (LIPITOR) 40 MG tablet Take 40 mg by mouth at bedtime. 02/18/14  Yes  Historical Provider, MD  DIGOX 250 MCG tablet Take 0.125 mg by mouth daily.  02/18/14  Yes Historical Provider, MD  donepezil (ARICEPT) 10 MG tablet Take 10 mg by mouth at bedtime. 02/18/14  Yes Historical Provider, MD  glipiZIDE-metformin (METAGLIP) 2.5-500 MG per tablet Take 0.5 tablets by mouth 2 (two) times daily.   Yes Historical Provider, MD  megestrol (MEGACE) 40 MG tablet Take 40 mg by mouth daily as needed (for appetite).   Yes Historical Provider, MD  Multiple Vitamin (MULTIVITAMIN WITH MINERALS) TABS tablet Take 1 tablet by mouth daily.   Yes Historical Provider, MD  Omega-3 Fatty Acids (FISH OIL) 1000 MG CAPS Take 1 capsule by mouth daily.   Yes Historical Provider, MD  tamsulosin (FLOMAX) 0.4 MG CAPS capsule Take 0.4 mg by mouth at bedtime.   Yes Historical Provider, MD  ciprofloxacin (CIPRO) 500 MG tablet Take 1 tablet (500 mg total) by mouth 2 (two) times daily. One po bid x 7 days Patient not taking: Reported on 06/20/2014 03/20/14   Benny Lennert, MD   BP 125/78 mmHg  Pulse 97  Temp(Src) 101.9 F (38.8 C) (Rectal)  Resp 24  SpO2 98% Physical Exam  Constitutional:  Ill-appearing but nontoxic, no acute distress  HENT:  Head: Normocephalic and atraumatic.  Eyes: Pupils are equal, round, and reactive to light.  Neck: Neck supple.  Cardiovascular: Regular rhythm and normal heart sounds.   No murmur heard. Tachycardia  Pulmonary/Chest:  Effort normal and breath sounds normal. No respiratory distress. He has no wheezes.  Abdominal: Soft. There is no tenderness.  Musculoskeletal: He exhibits no edema.  Neurological: He is alert.  Oriented to person and place  Skin: Skin is warm and dry.  Psychiatric: He has a normal mood and affect.  Nursing note and vitals reviewed.   ED Course  Procedures (including critical care time) Labs Review Labs Reviewed  CBC WITH DIFFERENTIAL - Abnormal; Notable for the following:    HCT 38.3 (*)    Neutrophils Relative % 96 (*)    Neutro  Abs 8.6 (*)    Lymphocytes Relative 3 (*)    Lymphs Abs 0.3 (*)    Monocytes Relative 0 (*)    Monocytes Absolute 0.0 (*)    All other components within normal limits  COMPREHENSIVE METABOLIC PANEL - Abnormal; Notable for the following:    Potassium 3.5 (*)    CO2 15 (*)    Glucose, Bld 133 (*)    BUN 36 (*)    Creatinine, Ser 1.53 (*)    Albumin 3.3 (*)    GFR calc non Af Amer 39 (*)    GFR calc Af Amer 46 (*)    Anion gap 19 (*)    All other components within normal limits  LACTIC ACID, PLASMA - Abnormal; Notable for the following:    Lactic Acid, Venous 2.4 (*)    All other components within normal limits  URINALYSIS, ROUTINE W REFLEX MICROSCOPIC - Abnormal; Notable for the following:    Color, Urine STRAW (*)    APPearance CLOUDY (*)    Hgb urine dipstick LARGE (*)    Protein, ur 30 (*)    Leukocytes, UA SMALL (*)    All other components within normal limits  URINE MICROSCOPIC-ADD ON - Abnormal; Notable for the following:    Squamous Epithelial / LPF MANY (*)    Bacteria, UA MANY (*)    All other components within normal limits  URINE CULTURE  CULTURE, BLOOD (ROUTINE X 2)  CULTURE, BLOOD (ROUTINE X 2)    Imaging Review Dg Chest 2 View  06/20/2014   CLINICAL DATA:  Fever.  EXAM: CHEST  2 VIEW  COMPARISON:  06/05/2014  FINDINGS: Normal heart size. Tortuous thoracic aorta noted. No pleural effusion or edema. No airspace consolidation. The lung volumes are low and there is gaseous distension of the bowel loops.  IMPRESSION: 1. Low lung volumes. 2. No acute cardiopulmonary abnormalities.   Electronically Signed   By: Signa Kellaylor  Stroud M.D.   On: 06/20/2014 21:09     EKG Interpretation   Date/Time:  Tuesday June 20 2014 20:05:15 EST Ventricular Rate:  124 PR Interval:  130 QRS Duration: 88 QT Interval:  337 QTC Calculation: 484 R Axis:   12 Text Interpretation:  Sinus tachycardia Repolarization abnormality, prob  rate related Confirmed by HORTON  MD, Toni AmendOURTNEY  ([id number]) on 06/20/2014  8:59:31 PM      MDM   Final diagnoses:  Fever  UTI (lower urinary tract infection)  Sepsis, due to unspecified organism   Patient presents with fever, generalized weakness, chills at home. Rectal temperature 103.5. History taken mostly from wife. Nontoxic on exam but ill-appearing.  Lab work notable for Urine with many bacteria and 3-6 white cells as well as to numerous to count red cells. Suspect urinary tract infection. Lactate mildly elevated to 2.4. Patient given fluids and rectal Tylenol for fever and tachycardia as well as mild lactic  acidosis.  Otherwise workup reassuring. Will admit for sepsis secondary to urinary tract infection. Patient given Zosyn given history of enterococcus UTI susceptible to Zosyn.    Shon Batonourtney F Horton, MD 06/20/14 [phone]

## 2014-06-20 NOTE — ED Notes (Signed)
Fever and chills at home x 2 days, generalized weakness

## 2014-06-21 DIAGNOSIS — N390 Urinary tract infection, site not specified: Secondary | ICD-10-CM

## 2014-06-21 DIAGNOSIS — I10 Essential (primary) hypertension: Secondary | ICD-10-CM

## 2014-06-21 DIAGNOSIS — IMO0001 Reserved for inherently not codable concepts without codable children: Secondary | ICD-10-CM | POA: Insufficient documentation

## 2014-06-21 DIAGNOSIS — A419 Sepsis, unspecified organism: Principal | ICD-10-CM

## 2014-06-21 DIAGNOSIS — N400 Enlarged prostate without lower urinary tract symptoms: Secondary | ICD-10-CM

## 2014-06-21 LAB — COMPREHENSIVE METABOLIC PANEL
ALT: 11 U/L (ref 0–53)
ANION GAP: 15 (ref 5–15)
AST: 15 U/L (ref 0–37)
Albumin: 2.9 g/dL — ABNORMAL LOW (ref 3.5–5.2)
Alkaline Phosphatase: 72 U/L (ref 39–117)
BUN: 35 mg/dL — AB (ref 6–23)
CO2: 17 mEq/L — ABNORMAL LOW (ref 19–32)
Calcium: 8.2 mg/dL — ABNORMAL LOW (ref 8.4–10.5)
Chloride: 108 mEq/L (ref 96–112)
Creatinine, Ser: 1.8 mg/dL — ABNORMAL HIGH (ref 0.50–1.35)
GFR calc Af Amer: 38 mL/min — ABNORMAL LOW (ref >90)
GFR calc non Af Amer: 32 mL/min — ABNORMAL LOW (ref >90)
Glucose, Bld: 167 mg/dL — ABNORMAL HIGH (ref 70–99)
Potassium: 4.1 mEq/L (ref 3.7–5.3)
Sodium: 140 mEq/L (ref 137–147)
Total Bilirubin: 0.4 mg/dL (ref 0.3–1.2)
Total Protein: 6.7 g/dL (ref 6.0–8.3)

## 2014-06-21 LAB — CBC
HEMATOCRIT: 36.2 % — AB (ref 39.0–52.0)
Hemoglobin: 12.2 g/dL — ABNORMAL LOW (ref 13.0–17.0)
MCH: 27.8 pg (ref 26.0–34.0)
MCHC: 33.7 g/dL (ref 30.0–36.0)
MCV: 82.5 fL (ref 78.0–100.0)
Platelets: 336 10*3/uL (ref 150–400)
RBC: 4.39 MIL/uL (ref 4.22–5.81)
RDW: 14.3 % (ref 11.5–15.5)
WBC: 38.4 10*3/uL — AB (ref 4.0–10.5)

## 2014-06-21 LAB — GLUCOSE, CAPILLARY
Glucose-Capillary: 160 mg/dL — ABNORMAL HIGH (ref 70–99)
Glucose-Capillary: 194 mg/dL — ABNORMAL HIGH (ref 70–99)
Glucose-Capillary: 146 mg/dL — ABNORMAL HIGH (ref 70–99)

## 2014-06-21 MED ORDER — ASPIRIN EC 325 MG PO TBEC
325.0000 mg | DELAYED_RELEASE_TABLET | Freq: Every morning | ORAL | Status: DC
  Administered 2014-06-21 – 2014-06-23 (×3): 325 mg via ORAL
  Filled 2014-06-21 (×3): qty 1

## 2014-06-21 MED ORDER — SODIUM CHLORIDE 0.9 % IV SOLN
INTRAVENOUS | Status: DC
  Administered 2014-06-21 (×2): via INTRAVENOUS

## 2014-06-21 MED ORDER — ACETAMINOPHEN 650 MG RE SUPP: 650.0000 mg | Freq: Four times a day (QID) | RECTAL | Status: DC | PRN

## 2014-06-21 MED ORDER — ATORVASTATIN CALCIUM 40 MG PO TABS
40.0000 mg | ORAL_TABLET | Freq: Every day | ORAL | Status: DC
  Administered 2014-06-21 – 2014-06-22 (×3): 40 mg via ORAL
  Filled 2014-06-21 (×3): qty 1

## 2014-06-21 MED ORDER — CEFTRIAXONE SODIUM IN DEXTROSE 20 MG/ML IV SOLN: 1.0000 g | INTRAVENOUS | Status: DC

## 2014-06-21 MED ORDER — DONEPEZIL HCL 5 MG PO TABS
10.0000 mg | ORAL_TABLET | Freq: Every day | ORAL | Status: DC
  Administered 2014-06-21 – 2014-06-22 (×3): 10 mg via ORAL
  Filled 2014-06-21 (×3): qty 2

## 2014-06-21 MED ORDER — PIPERACILLIN-TAZOBACTAM IN DEX 2-0.25 GM/50ML IV SOLN
2.2500 g | Freq: Three times a day (TID) | INTRAVENOUS | Status: DC
  Administered 2014-06-21: 2.25 g via INTRAVENOUS
  Filled 2014-06-21 (×2): qty 50

## 2014-06-21 MED ORDER — TAMSULOSIN HCL 0.4 MG PO CAPS
0.4000 mg | ORAL_CAPSULE | Freq: Every day | ORAL | Status: DC
  Administered 2014-06-21 – 2014-06-22 (×3): 0.4 mg via ORAL
  Filled 2014-06-21 (×3): qty 1

## 2014-06-21 MED ORDER — PIPERACILLIN-TAZOBACTAM 3.375 G IVPB
3.3750 g | Freq: Three times a day (TID) | INTRAVENOUS | Status: DC
  Administered 2014-06-21 – 2014-06-22 (×3): 3.375 g via INTRAVENOUS
  Filled 2014-06-21 (×7): qty 50

## 2014-06-21 MED ORDER — SODIUM CHLORIDE 0.9 % IV SOLN: 250.0000 mL | INTRAVENOUS | Status: DC | PRN

## 2014-06-21 MED ORDER — ONDANSETRON HCL 4 MG/2ML IJ SOLN: 4.0000 mg | Freq: Four times a day (QID) | INTRAMUSCULAR | Status: DC | PRN

## 2014-06-21 MED ORDER — DIGOXIN 125 MCG PO TABS
0.1250 mg | ORAL_TABLET | Freq: Every day | ORAL | Status: DC
  Administered 2014-06-21 – 2014-06-23 (×3): 0.125 mg via ORAL
  Filled 2014-06-21 (×3): qty 1

## 2014-06-21 MED ORDER — ACETAMINOPHEN 325 MG PO TABS
650.0000 mg | ORAL_TABLET | Freq: Four times a day (QID) | ORAL | Status: DC | PRN
  Administered 2014-06-21: 650 mg via ORAL
  Filled 2014-06-21: qty 2

## 2014-06-21 MED ORDER — ALPRAZOLAM 0.25 MG PO TABS: 0.2500 mg | ORAL_TABLET | Freq: Three times a day (TID) | ORAL | Status: DC | PRN

## 2014-06-21 MED ORDER — PIPERACILLIN-TAZOBACTAM IN DEX 2-0.25 GM/50ML IV SOLN
INTRAVENOUS | Status: AC
  Filled 2014-06-21: qty 50

## 2014-06-21 MED ORDER — SODIUM CHLORIDE 0.9 % IJ SOLN
3.0000 mL | Freq: Two times a day (BID) | INTRAMUSCULAR | Status: DC
  Administered 2014-06-23: 3 mL via INTRAVENOUS

## 2014-06-21 MED ORDER — SODIUM CHLORIDE 0.9 % IJ SOLN: 3.0000 mL | INTRAMUSCULAR | Status: DC | PRN

## 2014-06-21 MED ORDER — INSULIN ASPART 100 UNIT/ML ~~LOC~~ SOLN
0.0000 [IU] | Freq: Three times a day (TID) | SUBCUTANEOUS | Status: DC
  Administered 2014-06-21: 2 [IU] via SUBCUTANEOUS
  Administered 2014-06-21: 1 [IU] via SUBCUTANEOUS
  Administered 2014-06-21: 2 [IU] via SUBCUTANEOUS
  Administered 2014-06-22: 1 [IU] via SUBCUTANEOUS
  Administered 2014-06-22 – 2014-06-23 (×3): 2 [IU] via SUBCUTANEOUS
  Administered 2014-06-23: 1 [IU] via SUBCUTANEOUS

## 2014-06-21 MED ORDER — ENOXAPARIN SODIUM 30 MG/0.3ML ~~LOC~~ SOLN
30.0000 mg | SUBCUTANEOUS | Status: DC
  Administered 2014-06-21 – 2014-06-23 (×3): 30 mg via SUBCUTANEOUS
  Filled 2014-06-21 (×3): qty 0.3

## 2014-06-21 MED ORDER — ONDANSETRON HCL 4 MG PO TABS: 4.0000 mg | ORAL_TABLET | Freq: Four times a day (QID) | ORAL | Status: DC | PRN

## 2014-06-21 NOTE — Plan of Care (Signed)
Problem: Phase I Progression Outcomes Goal: Tolerating diet Outcome: Completed/Met Date Met:  06/21/14

## 2014-06-21 NOTE — Plan of Care (Signed)
Problem: Phase I Progression Outcomes Goal: Vital Signs stable- temperature less than 102 Outcome: Completed/Met Date Met:  06/21/14

## 2014-06-21 NOTE — Plan of Care (Signed)
Problem: Phase I Progression Outcomes Goal: Voiding-avoid urinary catheter unless indicated Outcome: Completed/Met Date Met:  06/21/14 Patient using the urinal and ambulating to BR with assistance

## 2014-06-21 NOTE — Plan of Care (Signed)
Problem: Phase I Progression Outcomes Goal: Pain controlled with appropriate interventions Outcome: Not Applicable Date Met:  00/76/22

## 2014-06-21 NOTE — Plan of Care (Signed)
Problem: Phase I Progression Outcomes Goal: OOB as tolerated unless otherwise ordered Outcome: Progressing Informed patient not to ambulate alone

## 2014-06-21 NOTE — Progress Notes (Signed)
ANTIBIOTIC CONSULT NOTE - INITIAL  Pharmacy Consult for Zosyn Indication: UTI, possible bacteremia  No Known Allergies  Patient Measurements: Weight: 149 lb 11.1 oz (67.9 kg) Patient at approximate IBW  Vital Signs: Temp: 101 F (38.3 C) (11/25 0026) Temp Source: Rectal (11/25 0026) BP: 126/78 mmHg (11/25 0026) Pulse Rate: 100 (11/25 0026) Intake/Output from previous day:   Intake/Output from this shift:    Labs:  Recent Labs  06/20/14 2037  WBC 9.1  HGB 13.1  PLT 323  CREATININE 1.53*   Estimated Creatinine Clearance: 32.4 mL/min (by C-G formula based on Cr of 1.53). No results for input(s): VANCOTROUGH, VANCOPEAK, VANCORANDOM, GENTTROUGH, GENTPEAK, GENTRANDOM, TOBRATROUGH, TOBRAPEAK, TOBRARND, AMIKACINPEAK, AMIKACINTROU, AMIKACIN in the last 72 hours.   Microbiology: No results found for this or any previous visit (from the past 720 hour(s)).  Medical History: Past Medical History  Diagnosis Date  . Dementia   . Diabetes mellitus without complication   . Hypertension   . UTI (lower urinary tract infection)     Medications:  Scheduled:  . aspirin EC  325 mg Oral q morning - 10a  . atorvastatin  40 mg Oral QHS  . digoxin  0.125 mg Oral Daily  . donepezil  10 mg Oral QHS  . enoxaparin (LOVENOX) injection  30 mg Subcutaneous Q24H  . insulin aspart  0-9 Units Subcutaneous TID WC  . piperacillin-tazobactam (ZOSYN)  IV  2.25 g Intravenous 3 times per day  . sodium chloride  3 mL Intravenous Q12H  . tamsulosin  0.4 mg Oral QHS   Infusions:  . sodium chloride 75 mL/hr at 06/21/14 0110   PRN: sodium chloride, acetaminophen **OR** acetaminophen, ALPRAZolam, ondansetron **OR** ondansetron (ZOFRAN) IV, sodium chloride   Assessment: [age]yr male with UTI (hx of enterococcal UTI's in past), possible bacteremia.  CrCl est 20-3330ml/min  Goal of Therapy:  Systemic infection regimen for Zosyn is 3.375gm IV q8h if CrCl>1120ml/min.  Less aggressive regimen of 2.25gm IV q8h  is appropriate for CrCl<3120ml/min or for strictly UTI.    Plan:  1.  Zosyn 3.375gm IV x 1 dose given earlier last night 2.  Start Zosyn 2.25gm IV q8h at 6am. 3.  Follow for indices of infection and renal function - may need to adjust to more aggressive regimen, or alternative choice if Enterococcal UTI   Mattthew Ziomek, Shon Batoneil E 06/21/2014,1:04 AM

## 2014-06-21 NOTE — Plan of Care (Signed)
Problem: Phase I Progression Outcomes Goal: Tolerating diet Outcome: Progressing     

## 2014-06-21 NOTE — Care Management Note (Addendum)
    Page 1 of 1   06/23/2014     11:05:44 AM CARE MANAGEMENT NOTE 06/23/2014  Patient:  [name],[name]   Account Number:  [id number]  Date Initiated:  06/21/2014  Documentation initiated by:  Kathyrn SheriffHILDRESS,JESSICA  Subjective/Objective Assessment:   Pt is from home with wife. pt uses cane but is independent with ADLS's. Pt has no HH serivces, other DME's or med needs prior to admisison. Pt has used HH PT in past but unsure what agency was used.     Action/Plan:   Pt plans to discharge home with self care. No CM needs identified at this time.   Anticipated DC Date:  06/23/2014   Anticipated DC Plan:  HOME/SELF CARE      DC Planning Services  CM consult      Choice offered to / List presented to:             Status of service:  Completed, signed off Medicare Important Message given?  YES (If response is "NO", the following Medicare IM given date fields will be blank) Date Medicare IM given:  06/23/2014 Medicare IM given by:  Kathyrn SheriffHILDRESS,JESSICA Date Additional Medicare IM given:   Additional Medicare IM given by:    Discharge Disposition:  HOME/SELF CARE  Per UR Regulation:    If discussed at Long Length of Stay Meetings, dates discussed:    Comments:  06/23/2014 1100 Kathyrn SheriffJessica Childress, RN, MSN, PCCN Pt being dishcarged home. No CM needs identified at this time.  06/21/2014 0830 Kathyrn SheriffJessica Childress, RN, MSN, [facility]

## 2014-06-21 NOTE — Progress Notes (Signed)
ANTIBIOTIC CONSULT NOTE  Pharmacy Consult for Zosyn Indication: urosepsis  No Known Allergies  Patient Measurements: Weight: 149 lb 11.1 oz (67.9 kg)  Vital Signs: Temp: 98 F (36.7 C) (11/25 0500) Temp Source: Oral (11/25 0500) BP: 121/69 mmHg (11/25 0500) Pulse Rate: 82 (11/25 0500) Intake/Output from previous day: 11/24 0701 - 11/25 0700 In: -  Out: 600 [Urine:600] Intake/Output from this shift:    Labs:  Recent Labs  06/20/14 2037 06/21/14 0546  WBC 9.1 38.4*  HGB 13.1 12.2*  PLT 323 336  CREATININE 1.53* 1.80*   Estimated Creatinine Clearance: 27.5 mL/min (by C-G formula based on Cr of 1.8). No results for input(s): VANCOTROUGH, VANCOPEAK, VANCORANDOM, GENTTROUGH, GENTPEAK, GENTRANDOM, TOBRATROUGH, TOBRAPEAK, TOBRARND, AMIKACINPEAK, AMIKACINTROU, AMIKACIN in the last 72 hours.   Microbiology: No results found for this or any previous visit (from the past 720 hour(s)).  Anti-infectives    Start     Dose/Rate Route Frequency Ordered Stop   06/22/14 0600  cefTRIAXone (ROCEPHIN) 1 g in dextrose 5 % 50 mL IVPB - Premix  Status:  Discontinued     1 g100 mL/hr over 30 Minutes Intravenous Every 24 hours 06/21/14 0049 06/21/14 0051   06/21/14 1400  piperacillin-tazobactam (ZOSYN) IVPB 3.375 g     3.375 g12.5 mL/hr over 240 Minutes Intravenous 3 times per day 06/21/14 0810     06/21/14 0600  piperacillin-tazobactam (ZOSYN) IVPB 2.25 g  Status:  Discontinued     2.25 g100 mL/hr over 30 Minutes Intravenous 3 times per day 06/21/14 0111 06/21/14 0807   06/20/14 2200  piperacillin-tazobactam (ZOSYN) IVPB 3.375 g     3.375 g100 mL/hr over 30 Minutes Intravenous  Once 06/20/14 2159 06/20/14 2304   06/20/14 2145  cefTRIAXone (ROCEPHIN) 1 g in dextrose 5 % 50 mL IVPB  Status:  Discontinued     1 g100 mL/hr over 30 Minutes Intravenous  Once 06/20/14 2140 06/20/14 2159      Assessment: 88 yoM with hx dementia admitted with fever (Tm 103.68F), generalized weakness, and N/V/D.    WBC & lactic acid are elevated.  UA is abnormal.  He has a hx of enterococcal (sens ampicillin) and pseudomonas UTIs.   He was empirically started on Zosyn for possible urosepsis.  Acute renal insufficiency noted.  Estimated CrCl >5420ml/min.  Zosyn 11/24>>  Goal of Therapy:  Eradicate infection.  Plan:  Zosyn 3.375gm IV Q8h to be infused over 4hrs Monitor renal function and cx data  De-escalate antibiotics as appropriate  Richard Hays, Richard Hays 06/21/2014,8:16 AM

## 2014-06-21 NOTE — Progress Notes (Signed)
[facility] PROGRESS NOTE  Richard ArgueCharles Hays [name] DOB: 09-03-1925 DOA: 06/20/2014 PCP: Richard Hays  Assessment/Plan: 1. Sepsis with UTI 1. Urine cx pending 2. WBC trending up overnight dramatically to over 38k 3. Tmax of 101 at 0026 overnight 4. Cont abx for now 2. Gastroenteritis 1. GI pathogen pending 2. As pt note to have wbc of just under 40k 3. Will check stool for cdiff 3. DM 1. On SSI coverage 4. Dementia 1. On aricept 5. CKD 3 1. Monitor closely 6. DVT prophylaxis 1. lovenox  Code Status: Full Family Communication: Pt in room Disposition Plan: Pending  Consultants:  Procedures:    Antibiotics:  Zosyn 11/24>>>   HPI/Subjective: Feels better today and eager to go home.  Objective: Filed Vitals:   06/20/14 2300 06/21/14 0026 06/21/14 0500 06/21/14 0919  BP: 125/78 126/78 121/69   Pulse: 97 100 82 86  Temp:  101 F (38.3 C) 98 F (36.7 C)   TempSrc:  Rectal Oral   Resp: 24 23 22    Weight:  67.9 kg (149 lb 11.1 oz)    SpO2: 98% 97% 100%     Intake/Output Summary (Last 24 hours) at 06/21/14 1202 Last data filed at 06/21/14 0200  Gross per 24 hour  Intake      0 ml  Output    600 ml  Net   -600 ml   Filed Weights   06/21/14 0026  Weight: 67.9 kg (149 lb 11.1 oz)    Exam:   General:  Awake, in nad  Cardiovascular: regular, s1, s2  Respiratory: normal resp effort, no wheezing  Abdomen: soft, nondistended, nontender  Musculoskeletal: perfused, no clubbing   Data Reviewed: Basic Metabolic Panel:  Recent Labs Lab 06/20/14 2037 06/21/14 0546  NA 139 140  Hays 3.5* 4.1  CL 105 108  CO2 15* 17*  GLUCOSE 133* 167*  BUN 36* 35*  CREATININE 1.53* 1.80*  CALCIUM 8.6 8.2*   Liver Function Tests:  Recent Labs Lab 06/20/14 2037 06/21/14 0546  AST 15 15  ALT 13 11  ALKPHOS 103 72  BILITOT 0.3 0.4  PROT 7.5 6.7  ALBUMIN 3.3* 2.9*   No results for input(s): LIPASE, AMYLASE in the last 168 hours. No  results for input(s): AMMONIA in the last 168 hours. CBC:  Recent Labs Lab 06/20/14 2037 06/21/14 0546  WBC 9.1 38.4*  NEUTROABS 8.6*  --   HGB 13.1 12.2*  HCT 38.3* 36.2*  MCV 82.5 82.5  PLT 323 336   Cardiac Enzymes: No results for input(s): CKTOTAL, CKMB, CKMBINDEX, TROPONINI in the last 168 hours. BNP (last 3 results) No results for input(s): PROBNP in the last 8760 hours. CBG:  Recent Labs Lab 06/21/14 0910 06/21/14 1158  GLUCAP 160* 194*    Recent Results (from the past 240 hour(s))  Culture, blood (routine x 2)     Status: None (Preliminary result)   Collection Time: 06/20/14  8:37 PM  Result Value Ref Range Status   Specimen Description BLOOD LEFT HAND  Final   Special Requests BOTTLES DRAWN AEROBIC AND ANAEROBIC 6CC  Final   Culture NO GROWTH 1 DAY  Final   Report Status PENDING  Incomplete  Culture, blood (routine x 2)     Status: None (Preliminary result)   Collection Time: 06/20/14  8:37 PM  Result Value Ref Range Status   Specimen Description BLOOD RIGHT HAND  Final   Special Requests BOTTLES DRAWN AEROBIC AND ANAEROBIC 6CC  Final  Culture NO GROWTH 1 DAY  Final   Report Status PENDING  Incomplete     Studies: Dg Chest 2 View  06/20/2014   CLINICAL DATA:  Fever.  EXAM: CHEST  2 VIEW  COMPARISON:  06/05/2014  FINDINGS: Normal heart size. Tortuous thoracic aorta noted. No pleural effusion or edema. No airspace consolidation. The lung volumes are low and there is gaseous distension of the bowel loops.  IMPRESSION: 1. Low lung volumes. 2. No acute cardiopulmonary abnormalities.   Electronically Signed   By: Richard Kellaylor  Stroud M.D.   On: 06/20/2014 21:09    Scheduled Meds: . aspirin EC  325 mg Oral q morning - 10a  . atorvastatin  40 mg Oral QHS  . digoxin  0.125 mg Oral Daily  . donepezil  10 mg Oral QHS  . enoxaparin (LOVENOX) injection  30 mg Subcutaneous Q24H  . insulin aspart  0-9 Units Subcutaneous TID WC  . piperacillin-tazobactam (ZOSYN)  IV   3.375 g Intravenous 3 times per day  . sodium chloride  3 mL Intravenous Q12H  . tamsulosin  0.4 mg Oral QHS   Continuous Infusions: . sodium chloride 75 mL/hr at 06/21/14 0110    Principal Problem:   UTI (lower urinary tract infection) Active Problems:   Essential hypertension   BPH (benign prostatic hyperplasia)  Time spent: 35min  Richard Hays  [facility] Pager [phone]. If 7PM-7AM, please contact night-coverage at [email or web address], password [email or web address] 06/21/2014, 12:02 PM  LOS: 1 day

## 2014-06-21 NOTE — Care Management Utilization Note (Signed)
UR review complete.  

## 2014-06-21 NOTE — H&P (Addendum)
PCP:   [name], [name], MD   Chief Complaint:  Fever  HPI:  80 year old male who  has a past medical history of Dementia; Diabetes mellitus without complication; Hypertension; and UTI (lower urinary tract infection). Course was brought to the ED after patient developed fever, nausea vomiting and diarrhea. As per patient's wife he got up from the chair and had one episode of vomiting and fecal incontinence. There was no blood in the stool. Patient was shivering, in the ED he was found to have a MAXIMUM TEMPERATURE of 103.5. UA was abnormal with too numerous to count RBCs and small leukocytes. Denies chest pain, no shortness of breath. Denies dysuria urgency or frequency of urination. Patient has a history of enterococcus UTI in the past. He received 1 dose of Zosyn in the ED  Allergies:  No Known Allergies    Past Medical History  Diagnosis Date  . Dementia   . Diabetes mellitus without complication   . Hypertension   . UTI (lower urinary tract infection)     History reviewed. No pertinent past surgical history.  Prior to Admission medications   Medication Sig Start Date End Date Taking? Authorizing Provider  ALPRAZolam (XANAX) 0.25 MG tablet Take 0.25 mg by mouth 3 (three) times daily as needed for anxiety.   Yes Historical Provider, MD  aspirin EC 325 MG tablet Take 325 mg by mouth every morning.   Yes Historical Provider, MD  atorvastatin (LIPITOR) 40 MG tablet Take 40 mg by mouth at bedtime. 02/18/14  Yes Historical Provider, MD  DIGOX 250 MCG tablet Take 0.125 mg by mouth daily.  02/18/14  Yes Historical Provider, MD  donepezil (ARICEPT) 10 MG tablet Take 10 mg by mouth at bedtime. 02/18/14  Yes Historical Provider, MD  glipiZIDE-metformin (METAGLIP) 2.5-500 MG per tablet Take 0.5 tablets by mouth 2 (two) times daily.   Yes Historical Provider, MD  megestrol (MEGACE) 40 MG tablet Take 40 mg by mouth daily as needed (for appetite).   Yes Historical Provider, MD  Multiple  Vitamin (MULTIVITAMIN WITH MINERALS) TABS tablet Take 1 tablet by mouth daily.   Yes Historical Provider, MD  Omega-3 Fatty Acids (FISH OIL) 1000 MG CAPS Take 1 capsule by mouth daily.   Yes Historical Provider, MD  tamsulosin (FLOMAX) 0.4 MG CAPS capsule Take 0.4 mg by mouth at bedtime.   Yes Historical Provider, MD  ciprofloxacin (CIPRO) 500 MG tablet Take 1 tablet (500 mg total) by mouth 2 (two) times daily. One po bid x 7 days Patient not taking: Reported on 06/20/2014 03/20/14   Benny LennertJoseph L Zammit, MD    Social History:  reports that he has quit smoking. He does not have any smokeless tobacco history on file. He reports that he does not drink alcohol or use illicit drugs.  No family history on file.   All the positives are listed in BOLD  Review of Systems:  HEENT: Headache, blurred vision, runny nose, sore throat Neck: Hypothyroidism, hyperthyroidism,,lymphadenopathy Chest : Shortness of breath, history of COPD, Asthma Heart : Chest pain, history of coronary arterey disease GI:  Nausea, vomiting, diarrhea, constipation, GERD GU: Dysuria, urgency, frequency of urination, hematuria Neuro: Stroke, seizures, syncope Psych: Depression, anxiety, hallucinations   Physical Exam: Blood pressure 126/78, pulse 100, temperature 101 F (38.3 C), temperature source Rectal, resp. rate 23, weight 67.9 kg (149 lb 11.1 oz), SpO2 97 %. Constitutional:   Patient is a well-developed and well-nourished male* in no acute distress and cooperative with exam.  Head: Normocephalic and atraumatic Mouth: Mucus membranes moist Eyes: PERRL, EOMI, conjunctivae normal Neck: Supple, No Thyromegaly Cardiovascular: RRR, S1 normal, S2 normal Pulmonary/Chest: CTAB, no wheezes, rales, or rhonchi Abdominal: Soft. Non-tender, non-distended, bowel sounds are normal, no masses, organomegaly, or guarding present.  Neurological: A&O x3, Strength is normal and symmetric bilaterally, cranial nerve II-XII are grossly intact,  no focal motor deficit, sensory intact to light touch bilaterally.  Extremities : No Cyanosis, Clubbing or Edema  Labs on Admission:  Basic Metabolic Panel:  Recent Labs Lab 06/20/14 2037  NA 139  K 3.5*  CL 105  CO2 15*  GLUCOSE 133*  BUN 36*  CREATININE 1.53*  CALCIUM 8.6   Liver Function Tests:  Recent Labs Lab 06/20/14 2037  AST 15  ALT 13  ALKPHOS 103  BILITOT 0.3  PROT 7.5  ALBUMIN 3.3*   No results for input(s): LIPASE, AMYLASE in the last 168 hours. No results for input(s): AMMONIA in the last 168 hours. CBC:  Recent Labs Lab 06/20/14 2037  WBC 9.1  NEUTROABS 8.6*  HGB 13.1  HCT 38.3*  MCV 82.5  PLT 323   Cardiac Enzymes: No results for input(s): CKTOTAL, CKMB, CKMBINDEX, TROPONINI in the last 168 hours.  BNP (last 3 results) No results for input(s): PROBNP in the last 8760 hours. CBG: No results for input(s): GLUCAP in the last 168 hours.  Radiological Exams on Admission: Dg Chest 2 View  06/20/2014   CLINICAL DATA:  Fever.  EXAM: CHEST  2 VIEW  COMPARISON:  06/05/2014  FINDINGS: Normal heart size. Tortuous thoracic aorta noted. No pleural effusion or edema. No airspace consolidation. The lung volumes are low and there is gaseous distension of the bowel loops.  IMPRESSION: 1. Low lung volumes. 2. No acute cardiopulmonary abnormalities.   Electronically Signed   By: Signa Kellaylor  Stroud M.D.   On: 06/20/2014 21:09    EKG: Independently reviewed. Sinus tachycardia   Assessment/Plan Principal Problem:   UTI (lower urinary tract infection) Active Problems:   Essential hypertension   BPH (benign prostatic hyperplasia)   Diabetes Mellitus  Fever Likely due to UTI, urine culture has been obtained will continue with IV Zosyn. Follow the urine culture results Blood cultures 2 also have been obtained, lactic acid 2.4.  Gastroenteritis ? Cause, will keep the patient nothing by mouth. Zofran when necessary for nausea vomiting. Will obtain GI pathogen  panel.  Diabetes mellitus We will initiate sliding scale insulin with NovoLog.  Dementia No basilar disturbance at this time, stable. Continue Aricept 10 mg by mouth daily  CKD stage III Creatinine is mildly elevated to 1.53, will continue with IV normal saline. Follow BMP in a.m.  DVT prophylaxis Lovenox  Code status: Full code  Family discussion: Admission, patients condition and plan of care including tests being ordered have been discussed with the patient and his wife and daughter at bedside* who indicate understanding and agree with the plan and Code Status.   Time Spent on Admission: 65 minutes  Girtrude Enslin S [facility] Pager: [phone] 06/21/2014, 12:43 AM  If 7PM-7AM, please contact night-coverage  [email or web address]  Password [email or web address]

## 2014-06-22 LAB — GLUCOSE, CAPILLARY
GLUCOSE-CAPILLARY: 173 mg/dL — AB (ref 70–99)
Glucose-Capillary: 127 mg/dL — ABNORMAL HIGH (ref 70–99)
Glucose-Capillary: 179 mg/dL — ABNORMAL HIGH (ref 70–99)
Glucose-Capillary: 156 mg/dL — ABNORMAL HIGH (ref 70–99)
Glucose-Capillary: 113 mg/dL — ABNORMAL HIGH (ref 70–99)

## 2014-06-22 LAB — CBC
HCT: 33 % — ABNORMAL LOW (ref 39.0–52.0)
Hemoglobin: 11 g/dL — ABNORMAL LOW (ref 13.0–17.0)
MCH: 27.5 pg (ref 26.0–34.0)
MCHC: 33.3 g/dL (ref 30.0–36.0)
MCV: 82.5 fL (ref 78.0–100.0)
Platelets: 295 10*3/uL (ref 150–400)
RBC: 4 MIL/uL — ABNORMAL LOW (ref 4.22–5.81)
RDW: 14.6 % (ref 11.5–15.5)
WBC: 20.1 10*3/uL — ABNORMAL HIGH (ref 4.0–10.5)

## 2014-06-22 LAB — BASIC METABOLIC PANEL
Anion gap: 12 (ref 5–15)
BUN: 32 mg/dL — AB (ref 6–23)
CHLORIDE: 109 meq/L (ref 96–112)
CO2: 20 mEq/L (ref 19–32)
Calcium: 8.5 mg/dL (ref 8.4–10.5)
Creatinine, Ser: 1.69 mg/dL — ABNORMAL HIGH (ref 0.50–1.35)
GFR calc Af Amer: 41 mL/min — ABNORMAL LOW (ref >90)
GFR, EST NON AFRICAN AMERICAN: 35 mL/min — AB (ref >90)
Glucose, Bld: 132 mg/dL — ABNORMAL HIGH (ref 70–99)
POTASSIUM: 4 meq/L (ref 3.7–5.3)
SODIUM: 141 meq/L (ref 137–147)

## 2014-06-22 MED ORDER — CEFUROXIME AXETIL 250 MG PO TABS
500.0000 mg | ORAL_TABLET | Freq: Two times a day (BID) | ORAL | Status: DC
  Administered 2014-06-22 – 2014-06-23 (×3): 500 mg via ORAL
  Filled 2014-06-22 (×3): qty 2

## 2014-06-22 MED ORDER — CEFUROXIME AXETIL 250 MG PO TABS: 500.0000 mg | ORAL_TABLET | Freq: Two times a day (BID) | ORAL | Status: DC

## 2014-06-22 NOTE — Plan of Care (Signed)
Problem: Phase I Progression Outcomes Goal: Initial discharge plan identified Outcome: Completed/Met Date Met:  06/22/14 Goal: Hemodynamically stable Outcome: Completed/Met Date Met:  06/22/14  Problem: Phase II Progression Outcomes Goal: Vital signs remain stable, temperature < 100 Outcome: Completed/Met Date Met:  06/22/14 Goal: Tolerating diet Outcome: Completed/Met Date Met:  06/22/14     

## 2014-06-22 NOTE — Progress Notes (Signed)
[facility] PROGRESS NOTE  Richard ArgueCharles Hays [id number] DOB: [birth year] DOA: 06/20/2014 PCP: Richard Hays  Assessment/Plan: 1. Sepsis with UTI 1. Urine cx still pending 2. WBC now trending down from 38k to 20k 3. Afebrile overnight 4. Trial of transitioning to PO ceftin 5. Observe while awaiting cultures 2. Gastroenteritis 1. GI pathogen pending including cdiff 3. DM 1. On SSI coverage 4. Dementia 1. On aricept 5. CKD 3 1. Monitor closely 6. DVT prophylaxis 1. lovenox  Code Status: Full Family Communication: Pt in room, wife at bedside Disposition Plan: Pending  Consultants:  Procedures:    Antibiotics:  Zosyn 11/24>>>   HPI/Subjective: Cont to feel better. Eager to go home.  Objective: Filed Vitals:   06/21/14 0919 06/21/14 1417 06/21/14 2123 06/22/14 0612  BP:   134/62 128/71  Pulse: 86  77 78  Temp:  97.9 F (36.6 C) 98.5 F (36.9 C) 98.7 F (37.1 C)  TempSrc:  Oral Oral Oral  Resp:   20 20  Weight:      SpO2:   100% 99%    Intake/Output Summary (Last 24 hours) at 06/22/14 1429 Last data filed at 06/22/14 1353  Gross per 24 hour  Intake   1080 ml  Output    900 ml  Net    180 ml   Filed Weights   06/21/14 0026  Weight: 67.9 kg (149 lb 11.1 oz)    Exam:   General:  Awake, in nad  Cardiovascular: regular, s1, s2  Respiratory: normal resp effort, no wheezing  Abdomen: soft, nondistended, nontender  Musculoskeletal: perfused, no clubbing   Data Reviewed: Basic Metabolic Panel:  Recent Labs Lab 06/20/14 2037 06/21/14 0546 06/22/14 0559  NA 139 140 141  Hays 3.5* 4.1 4.0  CL 105 108 109  CO2 15* 17* 20  GLUCOSE 133* 167* 132*  BUN 36* 35* 32*  CREATININE 1.53* 1.80* 1.69*  CALCIUM 8.6 8.2* 8.5   Liver Function Tests:  Recent Labs Lab 06/20/14 2037 06/21/14 0546  AST 15 15  ALT 13 11  ALKPHOS 103 72  BILITOT 0.3 0.4  PROT 7.5 6.7  ALBUMIN 3.3* 2.9*   No results for input(s): LIPASE, AMYLASE in the  last 168 hours. No results for input(s): AMMONIA in the last 168 hours. CBC:  Recent Labs Lab 06/20/14 2037 06/21/14 0546 06/22/14 0559  WBC 9.1 38.4* 20.1*  NEUTROABS 8.6*  --   --   HGB 13.1 12.2* 11.0*  HCT 38.3* 36.2* 33.0*  MCV 82.5 82.5 82.5  PLT 323 336 295   Cardiac Enzymes: No results for input(s): CKTOTAL, CKMB, CKMBINDEX, TROPONINI in the last 168 hours. BNP (last 3 results) No results for input(s): PROBNP in the last 8760 hours. CBG:  Recent Labs Lab 06/21/14 1158 06/21/14 1631 06/21/14 2035 06/22/14 0741 06/22/14 1152  GLUCAP 194* 146* 173* 127* 179*    Recent Results (from the past 240 hour(s))  Culture, blood (routine x 2)     Status: None (Preliminary result)   Collection Time: 06/20/14  8:37 PM  Result Value Ref Range Status   Specimen Description BLOOD LEFT HAND  Final   Special Requests BOTTLES DRAWN AEROBIC AND ANAEROBIC 6CC  Final   Culture NO GROWTH 2 DAYS  Final   Report Status PENDING  Incomplete  Culture, blood (routine x 2)     Status: None (Preliminary result)   Collection Time: 06/20/14  8:37 PM  Result Value Ref Range Status   Specimen Description BLOOD RIGHT  HAND  Final   Special Requests BOTTLES DRAWN AEROBIC AND ANAEROBIC 6CC  Final   Culture NO GROWTH 2 DAYS  Final   Report Status PENDING  Incomplete     Studies: Dg Chest 2 View  06/20/2014   CLINICAL DATA:  Fever.  EXAM: CHEST  2 VIEW  COMPARISON:  06/05/2014  FINDINGS: Normal heart size. Tortuous thoracic aorta noted. No pleural effusion or edema. No airspace consolidation. The lung volumes are low and there is gaseous distension of the bowel loops.  IMPRESSION: 1. Low lung volumes. 2. No acute cardiopulmonary abnormalities.   Electronically Signed   By: Richard Kellaylor  Stroud M.D.   On: 06/20/2014 21:09    Scheduled Meds: . aspirin EC  325 mg Oral q morning - 10a  . atorvastatin  40 mg Oral QHS  . cefUROXime  500 mg Oral BID WC  . digoxin  0.125 mg Oral Daily  . donepezil  10 mg  Oral QHS  . enoxaparin (LOVENOX) injection  30 mg Subcutaneous Q24H  . insulin aspart  0-9 Units Subcutaneous TID WC  . sodium chloride  3 mL Intravenous Q12H  . tamsulosin  0.4 mg Oral QHS   Continuous Infusions: . sodium chloride 75 mL/hr at 06/21/14 1842    Principal Problem:   UTI (lower urinary tract infection) Active Problems:   Essential hypertension   BPH (benign prostatic hyperplasia)   Blood poisoning  Time spent: 35min  Richard Hays  [facility] Pager [phone]. If 7PM-7AM, please contact night-coverage at [email or web address], password [email or web address] 06/22/2014, 2:29 PM  LOS: 2 days

## 2014-06-23 DIAGNOSIS — B001 Herpesviral vesicular dermatitis: Secondary | ICD-10-CM

## 2014-06-23 LAB — GLUCOSE, CAPILLARY
GLUCOSE-CAPILLARY: 123 mg/dL — AB (ref 70–99)
GLUCOSE-CAPILLARY: 169 mg/dL — AB (ref 70–99)

## 2014-06-23 LAB — CBC
HEMATOCRIT: 33.8 % — AB (ref 39.0–52.0)
HEMOGLOBIN: 11.4 g/dL — AB (ref 13.0–17.0)
MCH: 27.8 pg (ref 26.0–34.0)
MCHC: 33.7 g/dL (ref 30.0–36.0)
MCV: 82.4 fL (ref 78.0–100.0)
Platelets: 324 10*3/uL (ref 150–400)
RBC: 4.1 MIL/uL — ABNORMAL LOW (ref 4.22–5.81)
RDW: 14.7 % (ref 11.5–15.5)
WBC: 11.2 10*3/uL — AB (ref 4.0–10.5)

## 2014-06-23 MED ORDER — CEFUROXIME AXETIL 500 MG PO TABS: 500.0000 mg | ORAL_TABLET | Freq: Two times a day (BID) | ORAL | Status: DC

## 2014-06-23 MED ORDER — VALACYCLOVIR HCL 500 MG PO TABS: 1000.0000 mg | ORAL_TABLET | Freq: Two times a day (BID) | ORAL | Status: DC

## 2014-06-23 MED ORDER — ACYCLOVIR 200 MG PO CAPS: 400.0000 mg | ORAL_CAPSULE | Freq: Three times a day (TID) | ORAL | Status: DC

## 2014-06-23 MED ORDER — ACYCLOVIR 200 MG PO CAPS
400.0000 mg | ORAL_CAPSULE | Freq: Three times a day (TID) | ORAL | Status: DC
  Administered 2014-06-23: 400 mg via ORAL
  Filled 2014-06-23: qty 2

## 2014-06-23 NOTE — Care Management Utilization Note (Signed)
UR review complete.  

## 2014-06-23 NOTE — Discharge Summary (Signed)
Physician Discharge Summary  Richard ArgueCharles Hays JXB:[id number] DOB: Feb 09, 1926 DOA: 06/20/2014  PCP: [name], [name], MD  Admit date: 06/20/2014 Discharge date: 06/23/2014  Time spent: 35 minutes  Recommendations for Outpatient Follow-up:  1. Follow up with PCP in 1-2 weeks  Discharge Diagnoses:  Principal Problem:   UTI (lower urinary tract infection) Active Problems:   Essential hypertension   BPH (benign prostatic hyperplasia)   Blood poisoning Herpes labialis  Discharge Condition: Improved  Diet recommendation: Carb modified  Filed Weights   06/21/14 0026  Weight: 67.9 kg (149 lb 11.1 oz)    History of present illness:  Please see h and p from 11/25 for details. Briefly, pt presents   Hospital Course:  1. Sepsis with ecoli UTI 1. Urine cx growing ecoli, sensitivities pending 2. WBC trending down from 38k to 11k 3. Afebrile 4. Successfully transitioned to PO ceftin, would complete course 2. Gastroenteritis 1. GI pathogen pending 2. Pt's symptoms have since resolved 3. Suspect n/v secondary to uti 3. DM 1. On SSI coverage 4. Dementia 1. On aricept 5. CKD 3 1. Monitor closely 6. DVT prophylaxis 1. lovenox 7. Herpes labialis 1. Herpetic appearing lesions over upper lips 2. Denies itching, numbness, generalized hives to suggest drug reaction 3. Tongue not swollen  Discharge Exam: Filed Vitals:   06/22/14 1405 06/22/14 2043 06/23/14 0613 06/23/14 0926  BP: 128/69 141/71 138/66   Pulse: 80 66 60 72  Temp: 98.2 F (36.8 C) 98.1 F (36.7 C) 98 F (36.7 C)   TempSrc:  Oral Oral   Resp: 20 20 20    Weight:      SpO2: 99% 100% 100%     General: Awake, in nad Cardiovascular: regular, s1, s2 Respiratory: normal resp effort, no wheezing  Discharge Instructions     Medication List    STOP taking these medications        ciprofloxacin 500 MG tablet  Commonly known as:  CIPRO      TAKE these medications        acyclovir 200 MG capsule  Commonly  known as:  ZOVIRAX  Take 2 capsules (400 mg total) by mouth 3 (three) times daily.     ALPRAZolam 0.25 MG tablet  Commonly known as:  XANAX  Take 0.25 mg by mouth 3 (three) times daily as needed for anxiety.     aspirin EC 325 MG tablet  Take 325 mg by mouth every morning.     atorvastatin 40 MG tablet  Commonly known as:  LIPITOR  Take 40 mg by mouth at bedtime.     cefUROXime 500 MG tablet  Commonly known as:  CEFTIN  Take 1 tablet (500 mg total) by mouth 2 (two) times daily with a meal.     DIGOX 0.25 MG tablet  Generic drug:  digoxin  Take 0.125 mg by mouth daily.     donepezil 10 MG tablet  Commonly known as:  ARICEPT  Take 10 mg by mouth at bedtime.     Fish Oil 1000 MG Caps  Take 1 capsule by mouth daily.     glipiZIDE-metformin 2.5-500 MG per tablet  Commonly known as:  METAGLIP  Take 0.5 tablets by mouth 2 (two) times daily.     megestrol 40 MG tablet  Commonly known as:  MEGACE  Take 40 mg by mouth daily as needed (for appetite).     multivitamin with minerals Tabs tablet  Take 1 tablet by mouth daily.     tamsulosin 0.4  MG Caps capsule  Commonly known as:  FLOMAX  Take 0.4 mg by mouth at bedtime.       No Known Allergies Follow-up Information    Follow up with [name], [name], MD. Schedule an appointment as soon as possible for a visit in 1 week.   Specialty:  Family Medicine   Contact information:   [address] [place] [address] [phone]        The results of significant diagnostics from this hospitalization (including imaging, microbiology, ancillary and laboratory) are listed below for reference.    Significant Diagnostic Studies: Dg Chest 2 View  06/20/2014   CLINICAL DATA:  Fever.  EXAM: CHEST  2 VIEW  COMPARISON:  06/05/2014  FINDINGS: Normal heart size. Tortuous thoracic aorta noted. No pleural effusion or edema. No airspace consolidation. The lung volumes are low and there is gaseous distension of the bowel loops.   IMPRESSION: 1. Low lung volumes. 2. No acute cardiopulmonary abnormalities.   Electronically Signed   By: Signa Kellaylor  Stroud M.D.   On: 06/20/2014 21:09   Dg Chest 2 View  06/05/2014   CLINICAL DATA:  Cough; lung history of smoking ; history of diabetes and hypertension and previous CVA  EXAM: CHEST  2 VIEW  COMPARISON:  Portable chest x-ray of March 20, 2014  FINDINGS: The lungs are adequately inflated and clear. The cardiac silhouette is mildly enlarged but stable. The pulmonary vascularity is not engorged. There is no pleural effusion or pneumothorax. There is mild tortuosity of the descending thoracic aorta. The trachea is midline. The bony thorax exhibits no acute abnormality. There are degenerative changes of the AC joints bilaterally.  IMPRESSION: There is mild stable enlargement of the cardiac silhouette. There is no acute cardiopulmonary abnormality.   Electronically Signed   By: David  [place]   On: 06/05/2014 13:19    Microbiology: Recent Results (from the past 240 hour(s))  Urine culture     Status: None (Preliminary result)   Collection Time: 06/20/14  8:28 PM  Result Value Ref Range Status   Specimen Description URINE, CLEAN CATCH  Final   Special Requests URINE, CATHETERIZED  Final   Culture  Setup Time   Final    06/21/2014 18:42 Performed at [facility]    Colony Count   Final    >=100,000 COLONIES/ML [facility]formed at [facility]    Culture   Final    ESCHERICHIA COLI Performed at [facility]    Report Status PENDING  Incomplete  Culture, blood (routine x 2)     Status: None (Preliminary result)   Collection Time: 06/20/14  8:37 PM  Result Value Ref Range Status   Specimen Description BLOOD LEFT HAND  Final   Special Requests BOTTLES DRAWN AEROBIC AND ANAEROBIC 6CC  Final   Culture NO GROWTH 2 DAYS  Final   Report Status PENDING  Incomplete  Culture, blood (routine x 2)     Status: None (Preliminary result)   Collection Time: 06/20/14  8:37 PM   Result Value Ref Range Status   Specimen Description BLOOD RIGHT HAND  Final   Special Requests BOTTLES DRAWN AEROBIC AND ANAEROBIC 6CC  Final   Culture NO GROWTH 2 DAYS  Final   Report Status PENDING  Incomplete     Labs: Basic Metabolic Panel:  Recent Labs Lab 06/20/14 2037 06/21/14 0546 06/22/14 0559  NA 139 140 141  K 3.5* 4.1 4.0  CL 105 108 109  CO2 15* 17*  20  GLUCOSE 133* 167* 132*  BUN 36* 35* 32*  CREATININE 1.53* 1.80* 1.69*  CALCIUM 8.6 8.2* 8.5   Liver Function Tests:  Recent Labs Lab 06/20/14 2037 06/21/14 0546  AST 15 15  ALT 13 11  ALKPHOS 103 72  BILITOT 0.3 0.4  PROT 7.5 6.7  ALBUMIN 3.3* 2.9*   No results for input(s): LIPASE, AMYLASE in the last 168 hours. No results for input(s): AMMONIA in the last 168 hours. CBC:  Recent Labs Lab 06/20/14 2037 06/21/14 0546 06/22/14 0559 06/23/14 0554  WBC 9.1 38.4* 20.1* 11.2*  NEUTROABS 8.6*  --   --   --   HGB 13.1 12.2* 11.0* 11.4*  HCT 38.3* 36.2* 33.0* 33.8*  MCV 82.5 82.5 82.5 82.4  PLT 323 336 295 324   Cardiac Enzymes: No results for input(s): CKTOTAL, CKMB, CKMBINDEX, TROPONINI in the last 168 hours. BNP: BNP (last 3 results) No results for input(s): PROBNP in the last 8760 hours. CBG:  Recent Labs Lab 06/22/14 0741 06/22/14 1152 06/22/14 1633 06/22/14 2040 06/23/14 0737  GLUCAP 127* 179* 156* 113* 123*   Signed:  Quiana Cobaugh K  [facility] 06/23/2014, 10:56 AM

## 2014-06-24 LAB — URINE CULTURE: Colony Count: >=100000

## 2014-06-25 LAB — CULTURE, BLOOD (ROUTINE X 2)
CULTURE: NO GROWTH
Culture: NO GROWTH

## 2014-08-07 DIAGNOSIS — Z6823 Body mass index (BMI) 23.0-23.9, adult: Secondary | ICD-10-CM | POA: Diagnosis not present

## 2014-08-07 DIAGNOSIS — N342 Other urethritis: Secondary | ICD-10-CM | POA: Diagnosis not present

## 2014-08-28 DIAGNOSIS — N342 Other urethritis: Secondary | ICD-10-CM | POA: Diagnosis not present

## 2014-08-28 DIAGNOSIS — Z6823 Body mass index (BMI) 23.0-23.9, adult: Secondary | ICD-10-CM | POA: Diagnosis not present

## 2014-09-04 DIAGNOSIS — Z87440 Personal history of urinary (tract) infections: Secondary | ICD-10-CM | POA: Diagnosis not present

## 2014-09-04 DIAGNOSIS — F0391 Unspecified dementia with behavioral disturbance: Secondary | ICD-10-CM | POA: Diagnosis not present

## 2014-09-04 DIAGNOSIS — R269 Unspecified abnormalities of gait and mobility: Secondary | ICD-10-CM | POA: Diagnosis not present

## 2014-09-05 DIAGNOSIS — Z6823 Body mass index (BMI) 23.0-23.9, adult: Secondary | ICD-10-CM | POA: Diagnosis not present

## 2014-09-05 DIAGNOSIS — E782 Mixed hyperlipidemia: Secondary | ICD-10-CM | POA: Diagnosis not present

## 2014-09-05 DIAGNOSIS — E119 Type 2 diabetes mellitus without complications: Secondary | ICD-10-CM | POA: Diagnosis not present

## 2014-09-05 DIAGNOSIS — I10 Essential (primary) hypertension: Secondary | ICD-10-CM | POA: Diagnosis not present

## 2014-09-07 DIAGNOSIS — Z87440 Personal history of urinary (tract) infections: Secondary | ICD-10-CM | POA: Diagnosis not present

## 2014-09-07 DIAGNOSIS — F0391 Unspecified dementia with behavioral disturbance: Secondary | ICD-10-CM | POA: Diagnosis not present

## 2014-09-07 DIAGNOSIS — R269 Unspecified abnormalities of gait and mobility: Secondary | ICD-10-CM | POA: Diagnosis not present

## 2014-09-12 DIAGNOSIS — R269 Unspecified abnormalities of gait and mobility: Secondary | ICD-10-CM | POA: Diagnosis not present

## 2014-09-12 DIAGNOSIS — Z87440 Personal history of urinary (tract) infections: Secondary | ICD-10-CM | POA: Diagnosis not present

## 2014-09-12 DIAGNOSIS — F0391 Unspecified dementia with behavioral disturbance: Secondary | ICD-10-CM | POA: Diagnosis not present

## 2014-09-14 DIAGNOSIS — F0391 Unspecified dementia with behavioral disturbance: Secondary | ICD-10-CM | POA: Diagnosis not present

## 2014-09-14 DIAGNOSIS — R269 Unspecified abnormalities of gait and mobility: Secondary | ICD-10-CM | POA: Diagnosis not present

## 2014-09-14 DIAGNOSIS — Z87440 Personal history of urinary (tract) infections: Secondary | ICD-10-CM | POA: Diagnosis not present

## 2014-09-18 DIAGNOSIS — F0391 Unspecified dementia with behavioral disturbance: Secondary | ICD-10-CM | POA: Diagnosis not present

## 2014-09-18 DIAGNOSIS — R269 Unspecified abnormalities of gait and mobility: Secondary | ICD-10-CM | POA: Diagnosis not present

## 2014-09-18 DIAGNOSIS — Z87440 Personal history of urinary (tract) infections: Secondary | ICD-10-CM | POA: Diagnosis not present

## 2014-10-05 DIAGNOSIS — R339 Retention of urine, unspecified: Secondary | ICD-10-CM | POA: Diagnosis not present

## 2014-10-31 DIAGNOSIS — N342 Other urethritis: Secondary | ICD-10-CM | POA: Diagnosis not present

## 2014-10-31 DIAGNOSIS — R3919 Other difficulties with micturition: Secondary | ICD-10-CM | POA: Diagnosis not present

## 2014-10-31 DIAGNOSIS — Z6823 Body mass index (BMI) 23.0-23.9, adult: Secondary | ICD-10-CM | POA: Diagnosis not present

## 2014-11-09 DIAGNOSIS — R339 Retention of urine, unspecified: Secondary | ICD-10-CM | POA: Diagnosis not present

## 2014-11-21 DIAGNOSIS — R69 Illness, unspecified: Secondary | ICD-10-CM | POA: Diagnosis not present

## 2014-11-22 ENCOUNTER — Emergency Department (HOSPITAL_COMMUNITY): Payer: Medicare Other

## 2014-11-22 ENCOUNTER — Emergency Department (HOSPITAL_COMMUNITY)
Admission: EM | Admit: 2014-11-22 | Discharge: 2014-11-22 | Disposition: A | Discharge status: Home Health | Payer: Medicare Other | Attending: Emergency Medicine | Admitting: Emergency Medicine

## 2014-11-22 ENCOUNTER — Encounter (HOSPITAL_COMMUNITY): Payer: Self-pay | Admitting: Emergency Medicine

## 2014-11-22 DIAGNOSIS — I10 Essential (primary) hypertension: Secondary | ICD-10-CM | POA: Insufficient documentation

## 2014-11-22 DIAGNOSIS — Z87440 Personal history of urinary (tract) infections: Secondary | ICD-10-CM | POA: Diagnosis not present

## 2014-11-22 DIAGNOSIS — Z8673 Personal history of transient ischemic attack (TIA), and cerebral infarction without residual deficits: Secondary | ICD-10-CM | POA: Insufficient documentation

## 2014-11-22 DIAGNOSIS — W1839XA Other fall on same level, initial encounter: Secondary | ICD-10-CM | POA: Insufficient documentation

## 2014-11-22 DIAGNOSIS — F1310 Sedative, hypnotic or anxiolytic abuse, uncomplicated: Secondary | ICD-10-CM | POA: Insufficient documentation

## 2014-11-22 DIAGNOSIS — R531 Weakness: Secondary | ICD-10-CM

## 2014-11-22 DIAGNOSIS — Z7982 Long term (current) use of aspirin: Secondary | ICD-10-CM | POA: Insufficient documentation

## 2014-11-22 DIAGNOSIS — Z87891 Personal history of nicotine dependence: Secondary | ICD-10-CM | POA: Insufficient documentation

## 2014-11-22 DIAGNOSIS — Y998 Other external cause status: Secondary | ICD-10-CM | POA: Insufficient documentation

## 2014-11-22 DIAGNOSIS — N289 Disorder of kidney and ureter, unspecified: Secondary | ICD-10-CM | POA: Diagnosis not present

## 2014-11-22 DIAGNOSIS — M6281 Muscle weakness (generalized): Secondary | ICD-10-CM | POA: Insufficient documentation

## 2014-11-22 DIAGNOSIS — Y9289 Other specified places as the place of occurrence of the external cause: Secondary | ICD-10-CM | POA: Insufficient documentation

## 2014-11-22 DIAGNOSIS — S8991XA Unspecified injury of right lower leg, initial encounter: Secondary | ICD-10-CM | POA: Diagnosis not present

## 2014-11-22 DIAGNOSIS — Z79899 Other long term (current) drug therapy: Secondary | ICD-10-CM | POA: Diagnosis not present

## 2014-11-22 DIAGNOSIS — Y9389 Activity, other specified: Secondary | ICD-10-CM | POA: Insufficient documentation

## 2014-11-22 DIAGNOSIS — E119 Type 2 diabetes mellitus without complications: Secondary | ICD-10-CM | POA: Diagnosis not present

## 2014-11-22 DIAGNOSIS — S0990XA Unspecified injury of head, initial encounter: Secondary | ICD-10-CM | POA: Diagnosis not present

## 2014-11-22 DIAGNOSIS — F0390 Unspecified dementia without behavioral disturbance: Secondary | ICD-10-CM | POA: Insufficient documentation

## 2014-11-22 DIAGNOSIS — E780 Pure hypercholesterolemia: Secondary | ICD-10-CM | POA: Diagnosis not present

## 2014-11-22 DIAGNOSIS — I638 Other cerebral infarction: Secondary | ICD-10-CM | POA: Diagnosis not present

## 2014-11-22 LAB — URINALYSIS, ROUTINE W REFLEX MICROSCOPIC
Bilirubin Urine: NEGATIVE
GLUCOSE, UA: NEGATIVE mg/dL
KETONES UR: NEGATIVE mg/dL
LEUKOCYTES UA: NEGATIVE
Nitrite: NEGATIVE
Specific Gravity, Urine: 1.025 (ref 1.005–1.030)
UROBILINOGEN UA: 1 mg/dL (ref 0.0–1.0)
pH: 5.5 (ref 5.0–8.0)

## 2014-11-22 LAB — APTT: aPTT: 29 seconds (ref 24–37)

## 2014-11-22 LAB — DIFFERENTIAL
Basophils Absolute: 0 10*3/uL (ref 0.0–0.1)
Basophils Relative: 1 % (ref 0–1)
EOS PCT: 9 % — AB (ref 0–5)
Eosinophils Absolute: 0.6 10*3/uL (ref 0.0–0.7)
LYMPHS ABS: 2.2 10*3/uL (ref 0.7–4.0)
Lymphocytes Relative: 31 % (ref 12–46)
Monocytes Absolute: 0.6 10*3/uL (ref 0.1–1.0)
Monocytes Relative: 8 % (ref 3–12)
NEUTROS ABS: 3.8 10*3/uL (ref 1.7–7.7)
NEUTROS PCT: 52 % (ref 43–77)

## 2014-11-22 LAB — PROTIME-INR
INR: 1.09 (ref 0.00–1.49)
Prothrombin Time: 14.2 seconds (ref 11.6–15.2)

## 2014-11-22 LAB — RAPID URINE DRUG SCREEN, HOSP PERFORMED
Amphetamines: NOT DETECTED
Barbiturates: NOT DETECTED
Benzodiazepines: POSITIVE — AB
COCAINE: NOT DETECTED
OPIATES: NOT DETECTED
TETRAHYDROCANNABINOL: NOT DETECTED

## 2014-11-22 LAB — I-STAT TROPONIN, ED: TROPONIN I, POC: 0.01 ng/mL (ref 0.00–0.08)

## 2014-11-22 LAB — CBC
HEMATOCRIT: 38.1 % — AB (ref 39.0–52.0)
HEMOGLOBIN: 12.5 g/dL — AB (ref 13.0–17.0)
MCH: 28.5 pg (ref 26.0–34.0)
MCHC: 32.8 g/dL (ref 30.0–36.0)
MCV: 87 fL (ref 78.0–100.0)
Platelets: 265 10*3/uL (ref 150–400)
RBC: 4.38 MIL/uL (ref 4.22–5.81)
RDW: 13.6 % (ref 11.5–15.5)
WBC: 7.2 10*3/uL (ref 4.0–10.5)

## 2014-11-22 LAB — BASIC METABOLIC PANEL
Anion gap: 10 (ref 5–15)
BUN: 25 mg/dL — AB (ref 6–23)
CO2: 21 mmol/L (ref 19–32)
Calcium: 9 mg/dL (ref 8.4–10.5)
Chloride: 106 mmol/L (ref 96–112)
Creatinine, Ser: 1.78 mg/dL — ABNORMAL HIGH (ref 0.50–1.35)
GFR calc Af Amer: 38 mL/min — ABNORMAL LOW (ref >90)
GFR, EST NON AFRICAN AMERICAN: 33 mL/min — AB (ref >90)
Glucose, Bld: 171 mg/dL — ABNORMAL HIGH (ref 70–99)
POTASSIUM: 4.1 mmol/L (ref 3.5–5.1)
SODIUM: 137 mmol/L (ref 135–145)

## 2014-11-22 LAB — ETHANOL: Alcohol, Ethyl (B): <5 mg/dL (ref 0–9)

## 2014-11-22 LAB — DIGOXIN LEVEL: Digoxin Level: <0.2 ng/mL — ABNORMAL LOW (ref 0.8–2.0)

## 2014-11-22 LAB — URINE MICROSCOPIC-ADD ON

## 2014-11-22 NOTE — ED Notes (Signed)
Per pt spouse, pt began having increased difficulty walking since last night. Pt spouse reports pt fell on Saturday and has had to use a can for walking ever since. Pt alert. Pt denies any pain, dizziness,numbness,changes in vision.

## 2014-11-22 NOTE — ED Provider Notes (Signed)
CSN: [id number]     Arrival date & time 11/22/14  4098 History  This chart was scribed for Richard Rhine, MD by Elveria Rising, ED scribe.  This patient was seen in room APA01/APA01 and the patient's care was started at 9:28 AM.   Chief Complaint  Patient presents with  . Extremity Weakness   Patient is a 81 y.o. male presenting with extremity weakness and fall. The history is provided by the spouse. The history is limited by the condition of the patient. No language interpreter was used.  Extremity Weakness This is a new problem. The current episode started more than 2 days ago. Pertinent negatives include no chest pain and no abdominal pain.  Fall The current episode started more than 2 days ago. Pertinent negatives include no chest pain and no abdominal pain.   Level 5 Caveat: history of dementia  HPI Comments: Richard Hays is a 81 y.o. male with PMHx of dementia who presents to the Emergency Department with family who reports development of lower extremity weakness and difficulty walking since last night when attempting to go to the bathroom. Spouse reports that upon wakening this morning the patient was able to maneuver his legs. Patient's spouse reports two recent falls: one four days ago and another two days ago. Spouse and daughter report that the patient has not been feeling well for a week now. Spouse reports history of stroke 6 years ago with resulting right leg weakness.   Past Medical History  Diagnosis Date  . Dementia   . Diabetes mellitus without complication   . Hypertension   . UTI (lower urinary tract infection)    History reviewed. No pertinent past surgical history. History reviewed. No pertinent family history. History  Substance Use Topics  . Smoking status: Former Games developer  . Smokeless tobacco: Not on file  . Alcohol Use: No    Review of Systems  Unable to perform ROS: Dementia  Constitutional: Negative for fever.  Eyes: Negative for visual disturbance.   Cardiovascular: Negative for chest pain.  Gastrointestinal: Negative for abdominal pain.  Genitourinary: Negative for dysuria.  Musculoskeletal: Positive for gait problem and extremity weakness. Negative for back pain.  Neurological: Positive for weakness.      Allergies  Sulfa antibiotics  Home Medications   Prior to Admission medications   Medication Sig Start Date End Date Taking? Authorizing Provider  ALPRAZolam (XANAX) 0.25 MG tablet Take 0.25 mg by mouth 3 (three) times daily as needed for anxiety.   Yes Historical Provider, MD  aspirin EC 325 MG tablet Take 325 mg by mouth every morning.   Yes Historical Provider, MD  atorvastatin (LIPITOR) 40 MG tablet Take 40 mg by mouth at bedtime. 02/18/14  Yes Historical Provider, MD  DIGOX 250 MCG tablet Take 0.125 mg by mouth daily.  02/18/14  Yes Historical Provider, MD  donepezil (ARICEPT) 10 MG tablet Take 10 mg by mouth at bedtime. 02/18/14  Yes Historical Provider, MD  glipiZIDE-metformin (METAGLIP) 2.5-500 MG per tablet Take 0.5 tablets by mouth 2 (two) times daily.   Yes Historical Provider, MD  lisinopril (PRINIVIL,ZESTRIL) 5 MG tablet Take 1 tablet by mouth daily. 10/23/14  Yes Historical Provider, MD  megestrol (MEGACE) 40 MG tablet Take 40 mg by mouth daily as needed (for appetite).   Yes Historical Provider, MD  Multiple Vitamin (MULTIVITAMIN WITH MINERALS) TABS tablet Take 1 tablet by mouth daily.   Yes Historical Provider, MD  nitrofurantoin (MACRODANTIN) 100 MG capsule Take 1 capsule by mouth  daily. 10/31/14  Yes Historical Provider, MD  Omega-3 Fatty Acids (FISH OIL) 1000 MG CAPS Take 1 capsule by mouth daily.   Yes Historical Provider, MD  tamsulosin (FLOMAX) 0.4 MG CAPS capsule Take 0.4 mg by mouth at bedtime.   Yes Historical Provider, MD  acyclovir (ZOVIRAX) 200 MG capsule Take 2 capsules (400 mg total) by mouth 3 (three) times daily. Patient not taking: Reported on 11/22/2014 06/23/14   Jerald Kief, MD  cefUROXime  (CEFTIN) 500 MG tablet Take 1 tablet (500 mg total) by mouth 2 (two) times daily with a meal. Patient not taking: Reported on 11/22/2014 06/23/14   Jerald Kief, MD   Triage Vitals: BP 140/91 mmHg  Pulse 86  Temp(Src) 98.7 F (37.1 C) (Oral)  Resp 16  Ht  (1.676 m)  Wt 145 lb (65.772 kg)  BMI 23.41 kg/m2  SpO2 98% Physical Exam  Nursing note and vitals reviewed.  CONSTITUTIONAL: Well developed/well nourished HEAD: Normocephalic/atraumatic EYES: EOMI/PERRL ENMT: Mucous membranes moist NECK: supple no meningeal signs SPINE/BACK:entire spine nontender CV: S1/S2 noted, no murmurs/rubs/gallops noted LUNGS: Lungs are clear to auscultation bilaterally, no apparent distress ABDOMEN: soft, nontender, no rebound or guarding, bowel sounds noted throughout abdomen GU:no cva tenderness NEURO: Pt is awake/alert/appropriate, moves all extremitiesx4.  No facial droop.  No arm drift, no drift of left leg, difficulty moving right leg, chronic  EXTREMITIES: pulses normal/equal, full ROM All extremities/joints palpated/ranged and nontender SKIN: warm, color normal PSYCH: flat affect  ED Course  Procedures   COORDINATION OF CARE: 9:35 AM- Discussed treatment plan with patient at bedside and patient agreed to plan.  tPA in stroke considered but not given due to: Onset over 3-4.5hours  Pt monitored for several hrs No new complaints No signs of trauma from recent fall He is well appearing Wife admits that his symptoms are not new - he has progressive cognitive decline.  He rarely leaves room.  He does require a lot of assistance.  He has no focal weakness to suggest CVA or myelopathy Daughter at bedside agrees Initial workup/imaging/labs reassuring I did tell them I can not r/o stroke completely with this testing (CT head)  I offered admission for further evaluation but pt would like to go home Wife would like him to go home but would appreciate home health Seen by CM - home health  arranged PCP followup arranged Pt/family agreeable with plan  BP 136/97 mmHg  Pulse 70  Temp(Src) 97.6 F (36.4 C) (Oral)  Resp 14  Ht  (1.676 m)  Wt 145 lb (65.772 kg)  BMI 23.41 kg/m2  SpO2 99% Pt ambulated in the ED Labs Review Labs Reviewed  CBC - Abnormal; Notable for the following:    Hemoglobin 12.5 (*)    HCT 38.1 (*)    All other components within normal limits  BASIC METABOLIC PANEL - Abnormal; Notable for the following:    Glucose, Bld 171 (*)    BUN 25 (*)    Creatinine, Ser 1.78 (*)    GFR calc non Af Amer 33 (*)    GFR calc Af Amer 38 (*)    All other components within normal limits  DIFFERENTIAL - Abnormal; Notable for the following:    Eosinophils Relative 9 (*)    All other components within normal limits  URINE RAPID DRUG SCREEN (HOSP PERFORMED) - Abnormal; Notable for the following:    Benzodiazepines POSITIVE (*)    All other components within normal limits  URINALYSIS,  ROUTINE W REFLEX MICROSCOPIC - Abnormal; Notable for the following:    Hgb urine dipstick SMALL (*)    Protein, ur TRACE (*)    All other components within normal limits  DIGOXIN LEVEL - Abnormal; Notable for the following:    Digoxin Level <0.2 (*)    All other components within normal limits  ETHANOL  PROTIME-INR  APTT  URINE MICROSCOPIC-ADD ON  Rosezena SensorI-STAT TROPOININ, ED    Imaging Review Ct Head Wo Contrast  11/22/2014   CLINICAL DATA:  Fall on 11/18/2014. Difficulty walking beginning last night. Lower extremity weakness. Dementia.  EXAM: CT HEAD WITHOUT CONTRAST  TECHNIQUE: Contiguous axial images were obtained from the base of the skull through the vertex without intravenous contrast.  COMPARISON:  03/20/2014  FINDINGS: Mildly asymmetric attenuation of right MCA branch vessels in the right sylvian fissure is slightly more prominent than on the prior CT. No parenchymal changes suggestive of acute infarct are identified. There is no evidence of acute intracranial hemorrhage,  mass, midline shift, or extra-axial fluid collection. There is mild generalized cerebral atrophy. Periventricular white-matter hypodensities are similar to the prior study and nonspecific but compatible with mild chronic small vessel ischemic disease.  Orbits are unremarkable. Visualized mastoid air cells are clear. There is minimal left ethmoid air cell mucosal thickening. Mild-to-moderate carotid siphon calcification is noted.  IMPRESSION: 1. No acute intracranial hemorrhage. 2. Mildly asymmetric hyperattenuation of right MCA branch vessels. No parenchymal changes indicative of acute or subacute infarction are identified, and the increased vessel density is favored to reflect underlying atherosclerosis although acute thrombus is not excluded.   Electronically Signed   By: Sebastian AcheAllen  Grady   On: 11/22/2014 10:37    ED ECG REPORT   Date: 11/22/2014 0912am  Rate: 83  Rhythm: normal sinus rhythm  QRS Axis: normal  Intervals: QT prolonged  ST/T Wave abnormalities: ST depressions inferiorly  Conduction Disutrbances:none  Narrative Interpretation:   Old EKG Reviewed: changes noted    MDM   Final diagnoses:  Dementia, without behavioral disturbance  Renal insufficiency  Weakness    Nursing notes including past medical history and social history reviewed and considered in documentation Labs/vital reviewed myself and considered during evaluation   I personally performed the services described in this documentation, which was scribed in my presence. The recorded information has been reviewed and is accurate.      Richard Rhineonald Sanaii Caporaso, MD 11/22/14 1650

## 2014-11-22 NOTE — Discharge Instructions (Signed)

## 2014-11-22 NOTE — ED Notes (Signed)
Pt ambulated 30 yards with walker and standby assistance.

## 2014-11-22 NOTE — ED Notes (Signed)
Pt has dementia and per wife has difficulty with memory and orientation at baseline.  Pt able to state name, dob, and wife's name.  Wife states that only change is that he is having trouble supporting his weight on his legs.  Strength equal bilaterally and sensation intact.

## 2014-11-23 NOTE — Care Management Note (Signed)
    Page 1 of 2   11/22/2014     2:21:09 PM CARE MANAGEMENT NOTE 11/22/2014  Patient:  Richard Hays,Richard Hays   Account Number:  [id number]  Date Initiated:  11/22/2014  Documentation initiated by:  Kathyrn SheriffHILDRESS,JESSICA  Subjective/Objective Assessment:   Pt lives at home with wife and has strong support from daughter. [facility], choosen by daughter, contacted and faxed pt info. Preston FleetingAdacia, [facility] rep, notified of referral.     Action/Plan:   Pt also ordered walker, family chooses [facility]. Emma notified and will obtain info from chart, family will pick up in store. DC planning discussed with MD and ED RN. No further CM needs.   Anticipated DC Date:  11/22/2014   Anticipated DC Plan:  HOME W HOME HEALTH SERVICES      DC Planning Services  CM consult      PAC Choice  DURABLE MEDICAL EQUIPMENT  HOME HEALTH   Choice offered to / List presented to:  C-4 Adult Children   DME arranged  WALKER - Lavone NianOLLING      DME agency  [facility]     HH arranged  HH-2 PT  HH-1 RN      [facility]   Status of service:  Completed, signed off Medicare Important Message given?   (If response is "NO", the following Medicare IM given date fields will be blank) Date Medicare IM given:   Medicare IM given by:   Date Additional Medicare IM given:   Additional Medicare IM given by:    Discharge Disposition:  HOME W HOME HEALTH SERVICES  Per UR Regulation:    If discussed at Long Length of Stay Meetings, dates discussed:    Comments:  11/22/2014 1400 Kathyrn SheriffJessica Childress, RN, MSN, CM

## 2014-11-30 DIAGNOSIS — F0390 Unspecified dementia without behavioral disturbance: Secondary | ICD-10-CM | POA: Diagnosis not present

## 2014-11-30 DIAGNOSIS — Z6823 Body mass index (BMI) 23.0-23.9, adult: Secondary | ICD-10-CM | POA: Diagnosis not present

## 2014-11-30 DIAGNOSIS — N3020 Other chronic cystitis without hematuria: Secondary | ICD-10-CM | POA: Diagnosis not present

## 2014-12-08 DIAGNOSIS — E119 Type 2 diabetes mellitus without complications: Secondary | ICD-10-CM | POA: Diagnosis not present

## 2014-12-08 DIAGNOSIS — R262 Difficulty in walking, not elsewhere classified: Secondary | ICD-10-CM | POA: Diagnosis not present

## 2014-12-08 DIAGNOSIS — I10 Essential (primary) hypertension: Secondary | ICD-10-CM | POA: Diagnosis not present

## 2014-12-08 DIAGNOSIS — Z87440 Personal history of urinary (tract) infections: Secondary | ICD-10-CM | POA: Diagnosis not present

## 2014-12-08 DIAGNOSIS — F0390 Unspecified dementia without behavioral disturbance: Secondary | ICD-10-CM | POA: Diagnosis not present

## 2014-12-08 DIAGNOSIS — Z9181 History of falling: Secondary | ICD-10-CM | POA: Diagnosis not present

## 2014-12-11 DIAGNOSIS — E119 Type 2 diabetes mellitus without complications: Secondary | ICD-10-CM | POA: Diagnosis not present

## 2014-12-11 DIAGNOSIS — Z87440 Personal history of urinary (tract) infections: Secondary | ICD-10-CM | POA: Diagnosis not present

## 2014-12-11 DIAGNOSIS — I10 Essential (primary) hypertension: Secondary | ICD-10-CM | POA: Diagnosis not present

## 2014-12-11 DIAGNOSIS — R262 Difficulty in walking, not elsewhere classified: Secondary | ICD-10-CM | POA: Diagnosis not present

## 2014-12-11 DIAGNOSIS — F0390 Unspecified dementia without behavioral disturbance: Secondary | ICD-10-CM | POA: Diagnosis not present

## 2014-12-11 DIAGNOSIS — Z9181 History of falling: Secondary | ICD-10-CM | POA: Diagnosis not present

## 2014-12-12 DIAGNOSIS — E119 Type 2 diabetes mellitus without complications: Secondary | ICD-10-CM | POA: Diagnosis not present

## 2014-12-12 DIAGNOSIS — Z87440 Personal history of urinary (tract) infections: Secondary | ICD-10-CM | POA: Diagnosis not present

## 2014-12-12 DIAGNOSIS — F0390 Unspecified dementia without behavioral disturbance: Secondary | ICD-10-CM | POA: Diagnosis not present

## 2014-12-12 DIAGNOSIS — I10 Essential (primary) hypertension: Secondary | ICD-10-CM | POA: Diagnosis not present

## 2014-12-12 DIAGNOSIS — R262 Difficulty in walking, not elsewhere classified: Secondary | ICD-10-CM | POA: Diagnosis not present

## 2014-12-12 DIAGNOSIS — Z9181 History of falling: Secondary | ICD-10-CM | POA: Diagnosis not present

## 2014-12-14 DIAGNOSIS — Z87440 Personal history of urinary (tract) infections: Secondary | ICD-10-CM | POA: Diagnosis not present

## 2014-12-14 DIAGNOSIS — F0390 Unspecified dementia without behavioral disturbance: Secondary | ICD-10-CM | POA: Diagnosis not present

## 2014-12-14 DIAGNOSIS — R262 Difficulty in walking, not elsewhere classified: Secondary | ICD-10-CM | POA: Diagnosis not present

## 2014-12-14 DIAGNOSIS — Z9181 History of falling: Secondary | ICD-10-CM | POA: Diagnosis not present

## 2014-12-14 DIAGNOSIS — I10 Essential (primary) hypertension: Secondary | ICD-10-CM | POA: Diagnosis not present

## 2014-12-14 DIAGNOSIS — E119 Type 2 diabetes mellitus without complications: Secondary | ICD-10-CM | POA: Diagnosis not present

## 2014-12-18 DIAGNOSIS — R262 Difficulty in walking, not elsewhere classified: Secondary | ICD-10-CM | POA: Diagnosis not present

## 2014-12-18 DIAGNOSIS — Z87440 Personal history of urinary (tract) infections: Secondary | ICD-10-CM | POA: Diagnosis not present

## 2014-12-18 DIAGNOSIS — F0390 Unspecified dementia without behavioral disturbance: Secondary | ICD-10-CM | POA: Diagnosis not present

## 2014-12-18 DIAGNOSIS — E119 Type 2 diabetes mellitus without complications: Secondary | ICD-10-CM | POA: Diagnosis not present

## 2014-12-18 DIAGNOSIS — Z9181 History of falling: Secondary | ICD-10-CM | POA: Diagnosis not present

## 2014-12-18 DIAGNOSIS — I10 Essential (primary) hypertension: Secondary | ICD-10-CM | POA: Diagnosis not present

## 2014-12-19 DIAGNOSIS — E119 Type 2 diabetes mellitus without complications: Secondary | ICD-10-CM | POA: Diagnosis not present

## 2014-12-19 DIAGNOSIS — Z9181 History of falling: Secondary | ICD-10-CM | POA: Diagnosis not present

## 2014-12-19 DIAGNOSIS — R262 Difficulty in walking, not elsewhere classified: Secondary | ICD-10-CM | POA: Diagnosis not present

## 2014-12-19 DIAGNOSIS — Z87440 Personal history of urinary (tract) infections: Secondary | ICD-10-CM | POA: Diagnosis not present

## 2014-12-19 DIAGNOSIS — F0390 Unspecified dementia without behavioral disturbance: Secondary | ICD-10-CM | POA: Diagnosis not present

## 2014-12-19 DIAGNOSIS — I10 Essential (primary) hypertension: Secondary | ICD-10-CM | POA: Diagnosis not present

## 2014-12-20 DIAGNOSIS — I10 Essential (primary) hypertension: Secondary | ICD-10-CM | POA: Diagnosis not present

## 2014-12-20 DIAGNOSIS — Z9181 History of falling: Secondary | ICD-10-CM | POA: Diagnosis not present

## 2014-12-20 DIAGNOSIS — F0390 Unspecified dementia without behavioral disturbance: Secondary | ICD-10-CM | POA: Diagnosis not present

## 2014-12-20 DIAGNOSIS — E119 Type 2 diabetes mellitus without complications: Secondary | ICD-10-CM | POA: Diagnosis not present

## 2014-12-20 DIAGNOSIS — Z87440 Personal history of urinary (tract) infections: Secondary | ICD-10-CM | POA: Diagnosis not present

## 2014-12-20 DIAGNOSIS — R262 Difficulty in walking, not elsewhere classified: Secondary | ICD-10-CM | POA: Diagnosis not present

## 2014-12-21 DIAGNOSIS — R262 Difficulty in walking, not elsewhere classified: Secondary | ICD-10-CM | POA: Diagnosis not present

## 2014-12-21 DIAGNOSIS — E119 Type 2 diabetes mellitus without complications: Secondary | ICD-10-CM | POA: Diagnosis not present

## 2014-12-21 DIAGNOSIS — Z9181 History of falling: Secondary | ICD-10-CM | POA: Diagnosis not present

## 2014-12-21 DIAGNOSIS — F0390 Unspecified dementia without behavioral disturbance: Secondary | ICD-10-CM | POA: Diagnosis not present

## 2014-12-21 DIAGNOSIS — Z87440 Personal history of urinary (tract) infections: Secondary | ICD-10-CM | POA: Diagnosis not present

## 2014-12-21 DIAGNOSIS — I10 Essential (primary) hypertension: Secondary | ICD-10-CM | POA: Diagnosis not present

## 2014-12-22 ENCOUNTER — Encounter (HOSPITAL_COMMUNITY): Payer: Self-pay | Admitting: *Deleted

## 2014-12-22 ENCOUNTER — Emergency Department (HOSPITAL_COMMUNITY): Payer: Medicare Other

## 2014-12-22 ENCOUNTER — Emergency Department (HOSPITAL_COMMUNITY)
Admission: EM | Admit: 2014-12-22 | Discharge: 2014-12-22 | Disposition: A | Discharge status: Home / Self Care | Payer: Medicare Other | Attending: Emergency Medicine | Admitting: Emergency Medicine

## 2014-12-22 DIAGNOSIS — R404 Transient alteration of awareness: Secondary | ICD-10-CM | POA: Diagnosis not present

## 2014-12-22 DIAGNOSIS — F0390 Unspecified dementia without behavioral disturbance: Secondary | ICD-10-CM | POA: Insufficient documentation

## 2014-12-22 DIAGNOSIS — Z79899 Other long term (current) drug therapy: Secondary | ICD-10-CM | POA: Diagnosis not present

## 2014-12-22 DIAGNOSIS — M6281 Muscle weakness (generalized): Secondary | ICD-10-CM | POA: Diagnosis not present

## 2014-12-22 DIAGNOSIS — R2243 Localized swelling, mass and lump, lower limb, bilateral: Secondary | ICD-10-CM | POA: Diagnosis present

## 2014-12-22 DIAGNOSIS — N390 Urinary tract infection, site not specified: Secondary | ICD-10-CM | POA: Diagnosis not present

## 2014-12-22 DIAGNOSIS — E119 Type 2 diabetes mellitus without complications: Secondary | ICD-10-CM | POA: Insufficient documentation

## 2014-12-22 DIAGNOSIS — R531 Weakness: Secondary | ICD-10-CM | POA: Diagnosis not present

## 2014-12-22 DIAGNOSIS — Z7982 Long term (current) use of aspirin: Secondary | ICD-10-CM | POA: Insufficient documentation

## 2014-12-22 DIAGNOSIS — R29898 Other symptoms and signs involving the musculoskeletal system: Secondary | ICD-10-CM | POA: Diagnosis not present

## 2014-12-22 DIAGNOSIS — R918 Other nonspecific abnormal finding of lung field: Secondary | ICD-10-CM | POA: Diagnosis not present

## 2014-12-22 DIAGNOSIS — I10 Essential (primary) hypertension: Secondary | ICD-10-CM | POA: Diagnosis not present

## 2014-12-22 DIAGNOSIS — R609 Edema, unspecified: Secondary | ICD-10-CM | POA: Diagnosis not present

## 2014-12-22 LAB — COMPREHENSIVE METABOLIC PANEL
ALBUMIN: 4.3 g/dL (ref 3.5–5.0)
ALT: 14 U/L — AB (ref 17–63)
AST: 15 U/L (ref 15–41)
Alkaline Phosphatase: 71 U/L (ref 38–126)
Anion gap: 11 (ref 5–15)
BILIRUBIN TOTAL: 0.4 mg/dL (ref 0.3–1.2)
BUN: 28 mg/dL — ABNORMAL HIGH (ref 6–20)
CO2: 22 mmol/L (ref 22–32)
Calcium: 9.3 mg/dL (ref 8.9–10.3)
Chloride: 103 mmol/L (ref 101–111)
Creatinine, Ser: 1.64 mg/dL — ABNORMAL HIGH (ref 0.61–1.24)
GFR calc non Af Amer: 36 mL/min — ABNORMAL LOW (ref >60)
GFR, EST AFRICAN AMERICAN: 42 mL/min — AB (ref >60)
Glucose, Bld: 139 mg/dL — ABNORMAL HIGH (ref 65–99)
Potassium: 4.5 mmol/L (ref 3.5–5.1)
SODIUM: 136 mmol/L (ref 135–145)
TOTAL PROTEIN: 7.8 g/dL (ref 6.5–8.1)

## 2014-12-22 LAB — URINALYSIS, ROUTINE W REFLEX MICROSCOPIC
Bilirubin Urine: NEGATIVE
Glucose, UA: 100 mg/dL — AB
Ketones, ur: NEGATIVE mg/dL
Nitrite: NEGATIVE
PH: 6 (ref 5.0–8.0)
Specific Gravity, Urine: 1.025 (ref 1.005–1.030)
Urobilinogen, UA: 0.2 mg/dL (ref 0.0–1.0)

## 2014-12-22 LAB — CBC WITH DIFFERENTIAL/PLATELET
Basophils Absolute: 0 10*3/uL (ref 0.0–0.1)
Basophils Relative: 0 % (ref 0–1)
EOS PCT: 6 % — AB (ref 0–5)
Eosinophils Absolute: 0.5 10*3/uL (ref 0.0–0.7)
HCT: 38.8 % — ABNORMAL LOW (ref 39.0–52.0)
HEMOGLOBIN: 12.7 g/dL — AB (ref 13.0–17.0)
LYMPHS ABS: 2.3 10*3/uL (ref 0.7–4.0)
Lymphocytes Relative: 29 % (ref 12–46)
MCH: 28.6 pg (ref 26.0–34.0)
MCHC: 32.7 g/dL (ref 30.0–36.0)
MCV: 87.4 fL (ref 78.0–100.0)
MONOS PCT: 8 % (ref 3–12)
Monocytes Absolute: 0.7 10*3/uL (ref 0.1–1.0)
NEUTROS PCT: 57 % (ref 43–77)
Neutro Abs: 4.5 10*3/uL (ref 1.7–7.7)
PLATELETS: 259 10*3/uL (ref 150–400)
RBC: 4.44 MIL/uL (ref 4.22–5.81)
RDW: 13.6 % (ref 11.5–15.5)
WBC: 8 10*3/uL (ref 4.0–10.5)

## 2014-12-22 LAB — URINE MICROSCOPIC-ADD ON

## 2014-12-22 LAB — TROPONIN I: Troponin I: <0.03 ng/mL (ref <0.031)

## 2014-12-22 LAB — BRAIN NATRIURETIC PEPTIDE: B Natriuretic Peptide: 22 pg/mL (ref 0.0–100.0)

## 2014-12-22 MED ORDER — CEFTRIAXONE SODIUM 1 G IJ SOLR
1.0000 g | Freq: Once | INTRAMUSCULAR | Status: AC
  Administered 2014-12-22: 1 g via INTRAVENOUS
  Filled 2014-12-22: qty 10

## 2014-12-22 MED ORDER — CEPHALEXIN 500 MG PO CAPS: 500.0000 mg | ORAL_CAPSULE | Freq: Four times a day (QID) | ORAL | Status: DC

## 2014-12-22 NOTE — ED Provider Notes (Signed)
CSN: [id number]     Arrival date & time 12/22/14  0113 History   First MD Initiated Contact with Patient 12/22/14 0115     Chief Complaint  Patient presents with  . Leg Swelling     (Consider location/radiation/quality/duration/timing/severity/associated sxs/prior Treatment) HPI Comments: Patient presents to the ER for evaluation of generalized weakness and swelling of his legs. Patient is accompanied by his wife. Wife reports that he has been sitting in a chair since 9 AM this morning. She has noticed progressive worsening of swelling of both of his legs, left greater than right. She reports that he has had several falls last month and now is afraid to get up and move around, causing him to sit in the chair all day. Upon arrival to the ER, patient denies shortness of breath or chest pain. He does have a history of dementia, however, cannot answer all questions appropriately secondary to dementia. Wife provides majority of information.   Past Medical History  Diagnosis Date  . Dementia   . Diabetes mellitus without complication   . Hypertension   . UTI (lower urinary tract infection)    History reviewed. No pertinent past surgical history. History reviewed. No pertinent family history. History  Substance Use Topics  . Smoking status: Former Games developer  . Smokeless tobacco: Not on file  . Alcohol Use: No    Review of Systems  Respiratory: Negative for shortness of breath.   Cardiovascular: Positive for leg swelling. Negative for chest pain.  Neurological: Positive for weakness (generalized).  All other systems reviewed and are negative.     Allergies  Sulfa antibiotics  Home Medications   Prior to Admission medications   Medication Sig Start Date End Date Taking? Authorizing Provider  ALPRAZolam (XANAX) 0.25 MG tablet Take 0.25 mg by mouth 3 (three) times daily as needed for anxiety.    Historical Provider, MD  aspirin EC 325 MG tablet Take 325 mg by mouth every morning.     Historical Provider, MD  atorvastatin (LIPITOR) 40 MG tablet Take 40 mg by mouth at bedtime. 02/18/14   Historical Provider, MD  DIGOX 250 MCG tablet Take 0.125 mg by mouth daily.  02/18/14   Historical Provider, MD  donepezil (ARICEPT) 10 MG tablet Take 10 mg by mouth at bedtime. 02/18/14   Historical Provider, MD  glipiZIDE-metformin (METAGLIP) 2.5-500 MG per tablet Take 0.5 tablets by mouth 2 (two) times daily.    Historical Provider, MD  lisinopril (PRINIVIL,ZESTRIL) 5 MG tablet Take 1 tablet by mouth daily. 10/23/14   Historical Provider, MD  megestrol (MEGACE) 40 MG tablet Take 40 mg by mouth daily as needed (for appetite).    Historical Provider, MD  Multiple Vitamin (MULTIVITAMIN WITH MINERALS) TABS tablet Take 1 tablet by mouth daily.    Historical Provider, MD  nitrofurantoin (MACRODANTIN) 100 MG capsule Take 1 capsule by mouth daily. 10/31/14   Historical Provider, MD  Omega-3 Fatty Acids (FISH OIL) 1000 MG CAPS Take 1 capsule by mouth daily.    Historical Provider, MD  tamsulosin (FLOMAX) 0.4 MG CAPS capsule Take 0.4 mg by mouth at bedtime.    Historical Provider, MD   BP 147/78 mmHg  Pulse 91  Temp(Src) 98.1 F (36.7 C) (Oral)  Resp 18  SpO2 98% Physical Exam  Constitutional: He is oriented to person, place, and time. He appears well-developed and well-nourished. No distress.  HENT:  Head: Normocephalic and atraumatic.  Right Ear: Hearing normal.  Left Ear: Hearing normal.  Nose: Nose  normal.  Mouth/Throat: Oropharynx is clear and moist and mucous membranes are normal.  Eyes: Conjunctivae and EOM are normal. Pupils are equal, round, and reactive to light.  Neck: Normal range of motion. Neck supple.  Cardiovascular: Regular rhythm, S1 normal and S2 normal.  Exam reveals no gallop and no friction rub.   No murmur heard. Pulmonary/Chest: Effort normal and breath sounds normal. No respiratory distress. He exhibits no tenderness.  Abdominal: Soft. Normal appearance and bowel sounds  are normal. There is no hepatosplenomegaly. There is no tenderness. There is no rebound, no guarding, no tenderness at McBurney's point and negative Murphy's sign. No hernia.  Musculoskeletal: Normal range of motion. He exhibits edema.  Neurological: He is alert and oriented to person, place, and time. He has normal strength. No cranial nerve deficit or sensory deficit. Coordination normal. GCS eye subscore is 4. GCS verbal subscore is 5. GCS motor subscore is 6.  Upper extremity grip strength and flexion extension of elbows is equal bilaterally. No pronator drift.  Lower extremity strength is 4+ out of 5, symmetric bilaterally.  No Sensory deficit  Skin: Skin is warm, dry and intact. No rash noted. No cyanosis.  Psychiatric: He has a normal mood and affect. His speech is normal and behavior is normal. Thought content normal.  Nursing note and vitals reviewed.   ED Course  Procedures (including critical care time) Labs Review Labs Reviewed  CBC WITH DIFFERENTIAL/PLATELET - Abnormal; Notable for the following:    Hemoglobin 12.7 (*)    HCT 38.8 (*)    Eosinophils Relative 6 (*)    All other components within normal limits  COMPREHENSIVE METABOLIC PANEL - Abnormal; Notable for the following:    Glucose, Bld 139 (*)    BUN 28 (*)    Creatinine, Ser 1.64 (*)    ALT 14 (*)    GFR calc non Af Amer 36 (*)    GFR calc Af Amer 42 (*)    All other components within normal limits  URINALYSIS, ROUTINE W REFLEX MICROSCOPIC (NOT AT [facility]) - Abnormal; Notable for the following:    APPearance HAZY (*)    Glucose, UA 100 (*)    Hgb urine dipstick TRACE (*)    Protein, ur TRACE (*)    Leukocytes, UA TRACE (*)    All other components within normal limits  URINE MICROSCOPIC-ADD ON - Abnormal; Notable for the following:    Squamous Epithelial / LPF FEW (*)    Bacteria, UA MANY (*)    All other components within normal limits  URINE CULTURE  TROPONIN I  BRAIN NATRIURETIC PEPTIDE    Imaging  Review Dg Chest Port 1 View  12/22/2014   CLINICAL DATA:  Left-sided weakness for 2-3 days.  EXAM: PORTABLE CHEST - 1 VIEW  COMPARISON:  06/20/2014; 10/24/2010  FINDINGS: Grossly unchanged cardiac silhouette and mediastinal contours with mild tortuosity of the thoracic aorta. The lungs remain hyperexpanded. No focal airspace opacities. No pleural effusion or pneumothorax. No evidence of edema. No acute osseus abnormalities.  IMPRESSION: Hyperexpanded lungs without acute cardiopulmonary disease on this AP portable examination.   Electronically Signed   By: Simonne Come M.D.   On: 12/22/2014 02:04     EKG Interpretation   Date/Time:  Friday Dec 22 2014 01:26:57 EDT Ventricular Rate:  89 PR Interval:  159 QRS Duration: 83 QT Interval:  355 QTC Calculation: 432 R Axis:   6 Text Interpretation:  Sinus rhythm Nonspecific T abnormalities, diffuse  leads No  significant change since last tracing Confirmed by [facility]  MD,  Katherleen Folkes [phone]([id number]) on 12/22/2014 2:25:33 AM      MDM   Final diagnoses:  Edema   UTI  Patient brought to the ER for evaluation of generalized weakness and swelling of both lower extremities. Patient does take a fluid pill. Wife reports that he spent all day from approximately 9 AM today sitting up in a chair. She has noticed increased swelling in his legs when he sits like this. He seems to have been weaker than usual today. Wife thought that he was weak on the left side earlier today, but his examination does not reveal any unilateral weakness. He has equal strength and sensation bilaterally.  Workup reveals normal blood work but evidence of urinary tract infection on exam. There is no evidence of congestive heart failure. Swelling is secondary to peripheral edema.  Patient treated with Rocephin here in the ER. Urine culture pending. He is appropriate for discharge, continue antibiotic therapy with Keflex, follow-up with PCP.    Gilda Creasehristopher J Kloey Cazarez, MD 12/22/14 [phone]

## 2014-12-22 NOTE — ED Notes (Signed)
Pt brought in by [facility] for c/o left side weakness and bilateral lower extremity edema

## 2014-12-22 NOTE — ED Notes (Signed)
Pt has incontinence of bladder and bowel; pt cleaned and bed linens changed

## 2014-12-22 NOTE — Discharge Instructions (Signed)
Peripheral Edema You have swelling in your legs (peripheral edema). This swelling is due to excess accumulation of salt and water in your body. Edema may be a sign of heart, kidney or liver disease, or a side effect of a medication. It may also be due to problems in the leg veins. Elevating your legs and using special support stockings may be very helpful, if the cause of the swelling is due to poor venous circulation. Avoid long periods of standing, whatever the cause. Treatment of edema depends on identifying the cause. Chips, pretzels, pickles and other salty foods should be avoided. Restricting salt in your diet is almost always needed. Water pills (diuretics) are often used to remove the excess salt and water from your body via urine. These medicines prevent the kidney from reabsorbing sodium. This increases urine flow. Diuretic treatment may also result in lowering of potassium levels in your body. Potassium supplements may be needed if you have to use diuretics daily. Daily weights can help you keep track of your progress in clearing your edema. You should call your caregiver for follow up care as recommended. SEEK IMMEDIATE MEDICAL CARE IF:   You have increased swelling, pain, redness, or heat in your legs.  You develop shortness of breath, especially when lying down.  You develop chest or abdominal pain, weakness, or fainting.  You have a fever. Document Released: 08/21/2004 Document Revised: 10/06/2011 Document Reviewed: 08/01/2009 [facility] Patient Information 2015 [place], [place]. This information is not intended to replace advice given to you by your health care provider. Make sure you discuss any questions you have with your health care provider.  Urinary Tract Infection Urinary tract infections (UTIs) can develop anywhere along your urinary tract. Your urinary tract is your body's drainage system for removing wastes and extra water. Your urinary tract includes two kidneys, two ureters,  a bladder, and a urethra. Your kidneys are a pair of bean-shaped organs. Each kidney is about the size of your fist. They are located below your ribs, one on each side of your spine. CAUSES Infections are caused by microbes, which are microscopic organisms, including fungi, viruses, and bacteria. These organisms are so small that they can only be seen through a microscope. Bacteria are the microbes that most commonly cause UTIs. SYMPTOMS  Symptoms of UTIs may vary by age and gender of the patient and by the location of the infection. Symptoms in young women typically include a frequent and intense urge to urinate and a painful, burning feeling in the bladder or urethra during urination. Older women and men are more likely to be tired, shaky, and weak and have muscle aches and abdominal pain. A fever may mean the infection is in your kidneys. Other symptoms of a kidney infection include pain in your back or sides below the ribs, nausea, and vomiting. DIAGNOSIS To diagnose a UTI, your caregiver will ask you about your symptoms. Your caregiver also will ask to provide a urine sample. The urine sample will be tested for bacteria and white blood cells. White blood cells are made by your body to help fight infection. TREATMENT  Typically, UTIs can be treated with medication. Because most UTIs are caused by a bacterial infection, they usually can be treated with the use of antibiotics. The choice of antibiotic and length of treatment depend on your symptoms and the type of bacteria causing your infection. HOME CARE INSTRUCTIONS  If you were prescribed antibiotics, take them exactly as your caregiver instructs you. Finish the medication  even if you feel better after you have only taken some of the medication. °· Drink enough water and fluids to keep your urine clear or pale yellow. °· Avoid caffeine, tea, and carbonated beverages. They tend to irritate your bladder. °· Empty your bladder often. Avoid holding  urine for long periods of time. °· Empty your bladder before and after sexual intercourse. °· After a bowel movement, women should cleanse from front to back. Use each tissue only once. °SEEK MEDICAL CARE IF:  °· You have back pain. °· You develop a fever. °· Your symptoms do not begin to resolve within 3 days. °SEEK IMMEDIATE MEDICAL CARE IF:  °· You have severe back pain or lower abdominal pain. °· You develop chills. °· You have nausea or vomiting. °· You have continued burning or discomfort with urination. °MAKE SURE YOU:  °· Understand these instructions. °· Will watch your condition. °· Will get help right away if you are not doing well or get worse. °Document Released: 04/23/2005 Document Revised: 01/13/2012 Document Reviewed: 08/22/2011 °[employer]® Patient Information ©2015 [employer]. This information is not intended to replace advice given to you by your health care provider. Make sure you discuss any questions you have with your health care provider. ° °

## 2014-12-23 ENCOUNTER — Encounter (HOSPITAL_COMMUNITY): Payer: Self-pay

## 2014-12-23 ENCOUNTER — Emergency Department (HOSPITAL_COMMUNITY): Payer: Medicare Other

## 2014-12-23 ENCOUNTER — Emergency Department (HOSPITAL_COMMUNITY)
Admission: EM | Admit: 2014-12-23 | Discharge: 2014-12-23 | Disposition: A | Discharge status: Home / Self Care | Payer: Medicare Other | Attending: Emergency Medicine | Admitting: Emergency Medicine

## 2014-12-23 DIAGNOSIS — R531 Weakness: Secondary | ICD-10-CM | POA: Diagnosis not present

## 2014-12-23 DIAGNOSIS — R197 Diarrhea, unspecified: Secondary | ICD-10-CM | POA: Insufficient documentation

## 2014-12-23 DIAGNOSIS — Z792 Long term (current) use of antibiotics: Secondary | ICD-10-CM | POA: Diagnosis not present

## 2014-12-23 DIAGNOSIS — Z793 Long term (current) use of hormonal contraceptives: Secondary | ICD-10-CM | POA: Diagnosis not present

## 2014-12-23 DIAGNOSIS — F0390 Unspecified dementia without behavioral disturbance: Secondary | ICD-10-CM | POA: Insufficient documentation

## 2014-12-23 DIAGNOSIS — Z79899 Other long term (current) drug therapy: Secondary | ICD-10-CM | POA: Insufficient documentation

## 2014-12-23 DIAGNOSIS — R630 Anorexia: Secondary | ICD-10-CM | POA: Diagnosis not present

## 2014-12-23 DIAGNOSIS — Z7982 Long term (current) use of aspirin: Secondary | ICD-10-CM | POA: Insufficient documentation

## 2014-12-23 DIAGNOSIS — N390 Urinary tract infection, site not specified: Secondary | ICD-10-CM | POA: Insufficient documentation

## 2014-12-23 DIAGNOSIS — I10 Essential (primary) hypertension: Secondary | ICD-10-CM | POA: Diagnosis not present

## 2014-12-23 DIAGNOSIS — E119 Type 2 diabetes mellitus without complications: Secondary | ICD-10-CM | POA: Insufficient documentation

## 2014-12-23 DIAGNOSIS — R296 Repeated falls: Secondary | ICD-10-CM | POA: Diagnosis not present

## 2014-12-23 DIAGNOSIS — G479 Sleep disorder, unspecified: Secondary | ICD-10-CM | POA: Diagnosis not present

## 2014-12-23 DIAGNOSIS — R40241 Glasgow coma scale score 13-15: Secondary | ICD-10-CM | POA: Diagnosis not present

## 2014-12-23 DIAGNOSIS — Z87891 Personal history of nicotine dependence: Secondary | ICD-10-CM | POA: Insufficient documentation

## 2014-12-23 DIAGNOSIS — R262 Difficulty in walking, not elsewhere classified: Secondary | ICD-10-CM | POA: Diagnosis not present

## 2014-12-23 LAB — URINE CULTURE: Colony Count: 30000

## 2014-12-23 LAB — URINALYSIS, ROUTINE W REFLEX MICROSCOPIC
Bilirubin Urine: NEGATIVE
Glucose, UA: 250 mg/dL — AB
Ketones, ur: NEGATIVE mg/dL
Nitrite: NEGATIVE
Protein, ur: 30 mg/dL — AB
Specific Gravity, Urine: 1.02 (ref 1.005–1.030)
Urobilinogen, UA: 1 mg/dL (ref 0.0–1.0)
pH: 6 (ref 5.0–8.0)

## 2014-12-23 LAB — CBC WITH DIFFERENTIAL/PLATELET
Basophils Absolute: 0 10*3/uL (ref 0.0–0.1)
Basophils Relative: 0 % (ref 0–1)
Eosinophils Absolute: 0.3 10*3/uL (ref 0.0–0.7)
Eosinophils Relative: 5 % (ref 0–5)
HCT: 35.5 % — ABNORMAL LOW (ref 39.0–52.0)
Hemoglobin: 11.4 g/dL — ABNORMAL LOW (ref 13.0–17.0)
Lymphocytes Relative: 25 % (ref 12–46)
Lymphs Abs: 1.6 10*3/uL (ref 0.7–4.0)
MCH: 28.1 pg (ref 26.0–34.0)
MCHC: 32.1 g/dL (ref 30.0–36.0)
MCV: 87.7 fL (ref 78.0–100.0)
Monocytes Absolute: 0.5 10*3/uL (ref 0.1–1.0)
Monocytes Relative: 8 % (ref 3–12)
Neutro Abs: 3.9 10*3/uL (ref 1.7–7.7)
Neutrophils Relative %: 62 % (ref 43–77)
Platelets: 232 10*3/uL (ref 150–400)
RBC: 4.05 MIL/uL — ABNORMAL LOW (ref 4.22–5.81)
RDW: 13.4 % (ref 11.5–15.5)
WBC: 6.3 10*3/uL (ref 4.0–10.5)

## 2014-12-23 LAB — BASIC METABOLIC PANEL
Anion gap: 9 (ref 5–15)
BUN: 27 mg/dL — ABNORMAL HIGH (ref 6–20)
CO2: 22 mmol/L (ref 22–32)
Calcium: 8.7 mg/dL — ABNORMAL LOW (ref 8.9–10.3)
Chloride: 107 mmol/L (ref 101–111)
Creatinine, Ser: 1.64 mg/dL — ABNORMAL HIGH (ref 0.61–1.24)
GFR calc Af Amer: 42 mL/min — ABNORMAL LOW (ref >60)
GFR calc non Af Amer: 36 mL/min — ABNORMAL LOW (ref >60)
Glucose, Bld: 133 mg/dL — ABNORMAL HIGH (ref 65–99)
Potassium: 4.3 mmol/L (ref 3.5–5.1)
Sodium: 138 mmol/L (ref 135–145)

## 2014-12-23 LAB — URINE MICROSCOPIC-ADD ON

## 2014-12-23 MED ORDER — CIPROFLOXACIN HCL 250 MG PO TABS
500.0000 mg | ORAL_TABLET | Freq: Once | ORAL | Status: AC
  Administered 2014-12-23: 500 mg via ORAL
  Filled 2014-12-23: qty 2

## 2014-12-23 MED ORDER — CIPROFLOXACIN HCL 500 MG PO TABS
500.0000 mg | ORAL_TABLET | Freq: Two times a day (BID) | ORAL | Status: DC
Start: 2014-12-23 — End: 2015-02-24

## 2014-12-23 NOTE — ED Notes (Signed)
Patient with no complaints at this time. Respirations even and unlabored. Skin warm/dry. Discharge instructions reviewed with patient at this time. Patient given opportunity to voice concerns/ask questions. IV removed per policy and band-aid applied to site. Patient discharged at this time and left Emergency Department with steady gait.  

## 2014-12-23 NOTE — ED Provider Notes (Signed)
CSN: [id number]     Arrival date & time 12/23/14  1925 History  This chart was scribed for Richard RazorStephen Merryl Buckels, MD by Ronney LionSuzanne Le, ED Scribe. This patient was seen in room APA05/APA05 and the patient's care was started at 8:16 PM.    Chief Complaint  Patient presents with  . Weakness   Patient is a 81 y.o. male presenting with weakness. The history is provided by the patient. No language interpreter was used.  Weakness This is a new problem. The current episode started more than 1 week ago. The problem occurs constantly. The problem has been gradually worsening. Pertinent negatives include no chest pain, no abdominal pain, no headaches and no shortness of breath. Nothing aggravates the symptoms. Nothing relieves the symptoms. He has tried nothing for the symptoms.    HPI Comments: Richard Hays is a 81 y.o. male with a PMHx of dementia and right-sided CVA (per daughter) who presents to the Emergency Department complaining of generalized weakness. His wife also complains of 3 falls today, difficulty walking and getting out of bed without assistance due to weakness, and reduced speech volume, although he can respond to questions. However, his wife adds that he sometimes doesn't respond to her questions because he seems irritable and wants to sleep. His daughter states he has been sleeping a lot lately, and has had reduced PO intake. Patient was placed on Keflex yesterday for a UTI, for which he has taken 2 doses of. However, he has been having side effects of diarrhea. Patient denies any symptoms or pain at this time. He denies nausea. Patient has known allergies to Sulfa.   Past Medical History  Diagnosis Date  . Dementia   . Diabetes mellitus without complication   . Hypertension   . UTI (lower urinary tract infection)    History reviewed. No pertinent past surgical history. History reviewed. No pertinent family history. History  Substance Use Topics  . Smoking status: Former Games developermoker  . Smokeless  tobacco: Not on file  . Alcohol Use: No    Review of Systems  Constitutional: Positive for activity change (sleeping more than usual) and appetite change.  Respiratory: Negative for shortness of breath.   Cardiovascular: Negative for chest pain.  Gastrointestinal: Positive for diarrhea (as a side effect of Keflex). Negative for abdominal pain.  Neurological: Positive for weakness. Negative for headaches.  Psychiatric/Behavioral: Positive for sleep disturbance and agitation.  All other systems reviewed and are negative.   Allergies  Sulfa antibiotics  Home Medications   Prior to Admission medications   Medication Sig Start Date End Date Taking? Authorizing Provider  ALPRAZolam (XANAX) 0.25 MG tablet Take 0.25 mg by mouth 3 (three) times daily as needed for anxiety.    Historical Provider, MD  aspirin EC 325 MG tablet Take 325 mg by mouth every morning.    Historical Provider, MD  atorvastatin (LIPITOR) 40 MG tablet Take 40 mg by mouth at bedtime. 02/18/14   Historical Provider, MD  cephALEXin (KEFLEX) 500 MG capsule Take 1 capsule (500 mg total) by mouth 4 (four) times daily. 12/22/14   Gilda Creasehristopher J Pollina, MD  DIGOX 250 MCG tablet Take 0.125 mg by mouth daily.  02/18/14   Historical Provider, MD  donepezil (ARICEPT) 10 MG tablet Take 10 mg by mouth at bedtime. 02/18/14   Historical Provider, MD  glipiZIDE-metformin (METAGLIP) 2.5-500 MG per tablet Take 0.5 tablets by mouth 2 (two) times daily.    Historical Provider, MD  lisinopril (PRINIVIL,ZESTRIL) 5 MG tablet Take  1 tablet by mouth daily. 10/23/14   Historical Provider, MD  megestrol (MEGACE) 40 MG tablet Take 40 mg by mouth daily as needed (for appetite).    Historical Provider, MD  Multiple Vitamin (MULTIVITAMIN WITH MINERALS) TABS tablet Take 1 tablet by mouth daily.    Historical Provider, MD  nitrofurantoin (MACRODANTIN) 100 MG capsule Take 1 capsule by mouth daily. 10/31/14   Historical Provider, MD  Omega-3 Fatty Acids (FISH OIL)  1000 MG CAPS Take 1 capsule by mouth daily.    Historical Provider, MD  tamsulosin (FLOMAX) 0.4 MG CAPS capsule Take 0.4 mg by mouth at bedtime.    Historical Provider, MD   BP 134/89 mmHg  Pulse 87  Temp(Src) 98.4 F (36.9 C) (Oral)  Resp 18  Ht  (1.778 m)  Wt 150 lb (68.04 kg)  BMI 21.52 kg/m2  SpO2 100% Physical Exam  Constitutional: He is oriented to person, place, and time. He appears well-developed and well-nourished. No distress.  HENT:  Head: Normocephalic and atraumatic.  Eyes: Conjunctivae are normal. Right eye exhibits no discharge. Left eye exhibits no discharge.  Neck: Neck supple.  Cardiovascular: Normal rate, regular rhythm and normal heart sounds.  Exam reveals no gallop and no friction rub.   No murmur heard. Pulmonary/Chest: Effort normal and breath sounds normal. No respiratory distress.  Abdominal: Soft. He exhibits no distension. There is no tenderness.  Musculoskeletal: He exhibits no edema or tenderness.  Neurological: He is alert and oriented to person, place, and time. No cranial nerve deficit. He exhibits normal muscle tone. Coordination normal.  Speech is slow but understandable. Follows commands. 4/5 strength RLE.  Skin: Skin is warm and dry.  Psychiatric: He has a normal mood and affect. His behavior is normal. Thought content normal.  Nursing note and vitals reviewed.   ED Course  Procedures (including critical care time)  DIAGNOSTIC STUDIES: Oxygen Saturation is 100% on RA, normal by my interpretation.    COORDINATION OF CARE: 8:24 PM - Discussed treatment plan with pt's family at bedside which includes UA, blood tests, and CT scan, and pt's family agreed to plan.   Labs Review Labs Reviewed  URINALYSIS, ROUTINE W REFLEX MICROSCOPIC (NOT AT [facility]) - Abnormal; Notable for the following:    Glucose, UA 250 (*)    Hgb urine dipstick SMALL (*)    Protein, ur 30 (*)    Leukocytes, UA TRACE (*)    All other components within normal limits   CBC WITH DIFFERENTIAL/PLATELET - Abnormal; Notable for the following:    RBC 4.05 (*)    Hemoglobin 11.4 (*)    HCT 35.5 (*)    All other components within normal limits  BASIC METABOLIC PANEL - Abnormal; Notable for the following:    Glucose, Bld 133 (*)    BUN 27 (*)    Creatinine, Ser 1.64 (*)    Calcium 8.7 (*)    GFR calc non Af Amer 36 (*)    GFR calc Af Amer 42 (*)    All other components within normal limits  URINE MICROSCOPIC-ADD ON - Abnormal; Notable for the following:    Bacteria, UA FEW (*)    All other components within normal limits    Imaging Review Ct Head Wo Contrast  12/23/2014   CLINICAL DATA:  Acute onset of generalized weakness. Multiple recent falls. Difficulty walking. Initial encounter.  EXAM: CT HEAD WITHOUT CONTRAST  TECHNIQUE: Contiguous axial images were obtained from the base of the skull through  the vertex without intravenous contrast.  COMPARISON:  CT of the head performed 11/22/2014  FINDINGS: There is no evidence of acute infarction, mass lesion, or intra- or extra-axial hemorrhage on CT.  Prominence of the ventricles and sulci reflects mild to moderate cortical volume loss. Mild cerebellar atrophy is noted. Scattered periventricular white matter change likely reflects small vessel ischemic microangiopathy.  The brainstem and fourth ventricle are within normal limits. The basal ganglia are unremarkable in appearance. The cerebral hemispheres demonstrate grossly normal gray-white differentiation. No mass effect or midline shift is seen.  There is no evidence of fracture; visualized osseous structures are unremarkable in appearance. The orbits are within normal limits. The paranasal sinuses and mastoid air cells are well-aerated. No significant soft tissue abnormalities are seen.  IMPRESSION: 1. No evidence of traumatic intracranial injury or fracture. 2. Mild to moderate cortical volume loss and scattered small vessel ischemic microangiopathy.   Electronically  Signed   By: Roanna Raider M.D.   On: 12/23/2014 21:24   Dg Chest Port 1 View  12/22/2014   CLINICAL DATA:  Left-sided weakness for 2-3 days.  EXAM: PORTABLE CHEST - 1 VIEW  COMPARISON:  06/20/2014; 10/24/2010  FINDINGS: Grossly unchanged cardiac silhouette and mediastinal contours with mild tortuosity of the thoracic aorta. The lungs remain hyperexpanded. No focal airspace opacities. No pleural effusion or pneumothorax. No evidence of edema. No acute osseus abnormalities.  IMPRESSION: Hyperexpanded lungs without acute cardiopulmonary disease on this AP portable examination.   Electronically Signed   By: Simonne Come M.D.   On: 12/22/2014 02:04     EKG Interpretation   Date/Time:  Saturday Dec 23 2014 21:53:26 EDT Ventricular Rate:  69 PR Interval:  166 QRS Duration: 84 QT Interval:  559 QTC Calculation: 599 R Axis:   7 Text Interpretation:  Sinus rhythm Nonspecific T abnormalities, diffuse  leads Prolonged QT interval ED PHYSICIAN INTERPRETATION AVAILABLE IN [facility] Confirmed by TEST, Record ([id number]) on 12/25/2014 8:46:37 AM      MDM   Final diagnoses:  Weakness  UTI (lower urinary tract infection)        Richard Razor, MD 12/29/14 [id number]

## 2014-12-23 NOTE — Discharge Instructions (Signed)

## 2014-12-23 NOTE — ED Notes (Signed)
Patient having generalized weakness. Was seen here for a UTI within the past week. Stopped taking his antibiotic due to having diarrhea from it. It was probably around 90 degrees in the house per EMS. Can follow commands, stroke screen negative per EMS. CBG 157.

## 2015-01-05 DIAGNOSIS — R3919 Other difficulties with micturition: Secondary | ICD-10-CM | POA: Diagnosis not present

## 2015-01-05 DIAGNOSIS — Z6822 Body mass index (BMI) 22.0-22.9, adult: Secondary | ICD-10-CM | POA: Diagnosis not present

## 2015-01-05 DIAGNOSIS — R350 Frequency of micturition: Secondary | ICD-10-CM | POA: Diagnosis not present

## 2015-01-05 DIAGNOSIS — N390 Urinary tract infection, site not specified: Secondary | ICD-10-CM | POA: Diagnosis not present

## 2015-01-05 DIAGNOSIS — N342 Other urethritis: Secondary | ICD-10-CM | POA: Diagnosis not present

## 2015-01-19 ENCOUNTER — Encounter (HOSPITAL_COMMUNITY): Payer: Self-pay | Admitting: Emergency Medicine

## 2015-01-19 ENCOUNTER — Emergency Department (HOSPITAL_COMMUNITY): Payer: Medicare Other

## 2015-01-19 ENCOUNTER — Emergency Department (HOSPITAL_COMMUNITY)
Admission: EM | Admit: 2015-01-19 | Discharge: 2015-01-19 | Disposition: A | Discharge status: Home / Self Care | Payer: Medicare Other | Attending: Emergency Medicine | Admitting: Emergency Medicine

## 2015-01-19 DIAGNOSIS — I10 Essential (primary) hypertension: Secondary | ICD-10-CM | POA: Insufficient documentation

## 2015-01-19 DIAGNOSIS — E119 Type 2 diabetes mellitus without complications: Secondary | ICD-10-CM | POA: Insufficient documentation

## 2015-01-19 DIAGNOSIS — Z79899 Other long term (current) drug therapy: Secondary | ICD-10-CM | POA: Insufficient documentation

## 2015-01-19 DIAGNOSIS — R2243 Localized swelling, mass and lump, lower limb, bilateral: Secondary | ICD-10-CM | POA: Insufficient documentation

## 2015-01-19 DIAGNOSIS — Z87440 Personal history of urinary (tract) infections: Secondary | ICD-10-CM | POA: Insufficient documentation

## 2015-01-19 DIAGNOSIS — R531 Weakness: Secondary | ICD-10-CM | POA: Diagnosis not present

## 2015-01-19 DIAGNOSIS — Z7982 Long term (current) use of aspirin: Secondary | ICD-10-CM | POA: Insufficient documentation

## 2015-01-19 DIAGNOSIS — Z792 Long term (current) use of antibiotics: Secondary | ICD-10-CM | POA: Insufficient documentation

## 2015-01-19 DIAGNOSIS — F0390 Unspecified dementia without behavioral disturbance: Secondary | ICD-10-CM | POA: Diagnosis not present

## 2015-01-19 DIAGNOSIS — Z87891 Personal history of nicotine dependence: Secondary | ICD-10-CM | POA: Diagnosis not present

## 2015-01-19 DIAGNOSIS — M7989 Other specified soft tissue disorders: Secondary | ICD-10-CM | POA: Diagnosis not present

## 2015-01-19 LAB — CBC WITH DIFFERENTIAL/PLATELET
BASOS ABS: 0 10*3/uL (ref 0.0–0.1)
BASOS PCT: 0 % (ref 0–1)
EOS ABS: 0.6 10*3/uL (ref 0.0–0.7)
Eosinophils Relative: 7 % — ABNORMAL HIGH (ref 0–5)
HCT: 37.3 % — ABNORMAL LOW (ref 39.0–52.0)
Hemoglobin: 12.3 g/dL — ABNORMAL LOW (ref 13.0–17.0)
LYMPHS ABS: 1.7 10*3/uL (ref 0.7–4.0)
Lymphocytes Relative: 22 % (ref 12–46)
MCH: 28.7 pg (ref 26.0–34.0)
MCHC: 33 g/dL (ref 30.0–36.0)
MCV: 87.1 fL (ref 78.0–100.0)
Monocytes Absolute: 0.7 10*3/uL (ref 0.1–1.0)
Monocytes Relative: 9 % (ref 3–12)
NEUTROS PCT: 62 % (ref 43–77)
Neutro Abs: 4.8 10*3/uL (ref 1.7–7.7)
Platelets: 246 10*3/uL (ref 150–400)
RBC: 4.28 MIL/uL (ref 4.22–5.81)
RDW: 14.1 % (ref 11.5–15.5)
WBC: 7.7 10*3/uL (ref 4.0–10.5)

## 2015-01-19 LAB — URINALYSIS, ROUTINE W REFLEX MICROSCOPIC
Bilirubin Urine: NEGATIVE
GLUCOSE, UA: NEGATIVE mg/dL
Ketones, ur: NEGATIVE mg/dL
LEUKOCYTES UA: NEGATIVE
NITRITE: NEGATIVE
PH: 5.5 (ref 5.0–8.0)
Protein, ur: NEGATIVE mg/dL
SPECIFIC GRAVITY, URINE: 1.02 (ref 1.005–1.030)
Urobilinogen, UA: 0.2 mg/dL (ref 0.0–1.0)

## 2015-01-19 LAB — BASIC METABOLIC PANEL
Anion gap: 10 (ref 5–15)
BUN: 22 mg/dL — ABNORMAL HIGH (ref 6–20)
CO2: 22 mmol/L (ref 22–32)
Calcium: 8.8 mg/dL — ABNORMAL LOW (ref 8.9–10.3)
Chloride: 106 mmol/L (ref 101–111)
Creatinine, Ser: 1.62 mg/dL — ABNORMAL HIGH (ref 0.61–1.24)
GFR calc Af Amer: 43 mL/min — ABNORMAL LOW (ref >60)
GFR, EST NON AFRICAN AMERICAN: 37 mL/min — AB (ref >60)
GLUCOSE: 159 mg/dL — AB (ref 65–99)
POTASSIUM: 4.3 mmol/L (ref 3.5–5.1)
Sodium: 138 mmol/L (ref 135–145)

## 2015-01-19 LAB — URINE MICROSCOPIC-ADD ON

## 2015-01-19 MED ORDER — SODIUM CHLORIDE 0.9 % IV BOLUS (SEPSIS)
500.0000 mL | Freq: Once | INTRAVENOUS | Status: AC
  Administered 2015-01-19: 500 mL via INTRAVENOUS

## 2015-01-19 NOTE — ED Notes (Signed)
Wife reports patient has felt warm and she wasn't sure if he had a fever. Also reports his lower extremities seemed to have some swelling bilaterally. States generalized weakness more so than normal. Patient has history of dementia. Denies any pain at this time or complaints.

## 2015-01-19 NOTE — Discharge Instructions (Signed)
Drink plenty of fluids and follow up with your md next week °

## 2015-01-19 NOTE — ED Provider Notes (Signed)
CSN: [id number]     Arrival date & time 01/19/15  1954 History  This chart was scribed for Bethann Berkshire, MD by Evon Slack, ED Scribe. This patient was seen in room APA11/APA11 and the patient's care was started at 8:52 PM.      Chief Complaint  Patient presents with  . Weakness   Patient is a 81 y.o. male presenting with weakness. The history is provided by the spouse (pt has had a fever and has been weak). No language interpreter was used.  Weakness This is a new problem. The current episode started yesterday. The problem occurs rarely. The problem has not changed since onset.Pertinent negatives include no chest pain, no abdominal pain and no headaches.   HPI Comments: Raja Liska is a 81 y.o. male with PMHx of Dementia, DM, HTN and UTI who presents to the Emergency Department complaining of improving bilateral foot swelling. Wife states that he was having associated chills and worsening weakness. Wife doesn't report any medications PTA. Wife states that she thinks he is normal except today he didn't eat much and slept more than usual.      Past Medical History  Diagnosis Date  . Dementia   . Diabetes mellitus without complication   . Hypertension   . UTI (lower urinary tract infection)    History reviewed. No pertinent past surgical history. History reviewed. No pertinent family history. History  Substance Use Topics  . Smoking status: Former Games developer  . Smokeless tobacco: Not on file  . Alcohol Use: No    Review of Systems  Constitutional: Negative for appetite change and fatigue.  HENT: Negative for congestion, ear discharge and sinus pressure.   Eyes: Negative for discharge.  Respiratory: Negative for cough.   Cardiovascular: Negative for chest pain.  Gastrointestinal: Negative for abdominal pain and diarrhea.  Genitourinary: Negative for frequency and hematuria.  Musculoskeletal: Negative for back pain.  Skin: Negative for rash.  Neurological: Positive for  weakness. Negative for seizures and headaches.  Psychiatric/Behavioral: Negative for hallucinations.      Allergies  Sulfa antibiotics  Home Medications   Prior to Admission medications   Medication Sig Start Date End Date Taking? Authorizing Provider  ALPRAZolam (XANAX) 0.25 MG tablet Take 0.25 mg by mouth at bedtime.     Historical Provider, MD  aspirin EC 325 MG tablet Take 325 mg by mouth every morning.    Historical Provider, MD  atorvastatin (LIPITOR) 40 MG tablet Take 40 mg by mouth at bedtime. 02/18/14   Historical Provider, MD  cephALEXin (KEFLEX) 500 MG capsule Take 1 capsule (500 mg total) by mouth 4 (four) times daily. 12/22/14   Gilda Crease, MD  ciprofloxacin (CIPRO) 500 MG tablet Take 1 tablet (500 mg total) by mouth every 12 (twelve) hours. 12/23/14   Raeford Razor, MD  DIGOX 250 MCG tablet Take 0.125 mg by mouth daily.  02/18/14   Historical Provider, MD  digoxin (LANOXIN) 0.125 MG tablet Take 0.125 mg by mouth daily.    Historical Provider, MD  donepezil (ARICEPT) 10 MG tablet Take 10 mg by mouth at bedtime. 02/18/14   Historical Provider, MD  escitalopram (LEXAPRO) 10 MG tablet Take 5 mg by mouth daily. 12/19/14   Historical Provider, MD  finasteride (PROSCAR) 5 MG tablet Take 5 mg by mouth daily.    Historical Provider, MD  furosemide (LASIX) 20 MG tablet Take 20 mg by mouth daily.    Historical Provider, MD  glipiZIDE-metformin (METAGLIP) 2.5-500 MG per tablet Take  1 tablet by mouth daily.     Historical Provider, MD  lisinopril (PRINIVIL,ZESTRIL) 5 MG tablet Take 1 tablet by mouth daily. 10/23/14   Historical Provider, MD  megestrol (MEGACE) 40 MG tablet Take 40 mg by mouth daily as needed (for appetite).    Historical Provider, MD  Multiple Vitamin (MULTIVITAMIN WITH MINERALS) TABS tablet Take 1 tablet by mouth daily.    Historical Provider, MD  nitrofurantoin (MACRODANTIN) 100 MG capsule Take 1 capsule by mouth daily. 10/31/14   Historical Provider, MD   nitrofurantoin (MACRODANTIN) 50 MG capsule Take 100 mg by mouth daily.    Historical Provider, MD  Omega-3 Fatty Acids (FISH OIL) 1000 MG CAPS Take 1 capsule by mouth daily.    Historical Provider, MD  tamsulosin (FLOMAX) 0.4 MG CAPS capsule Take 0.4 mg by mouth at bedtime.    Historical Provider, MD  trimethoprim (TRIMPEX) 100 MG tablet Take 100 mg by mouth at bedtime.     Historical Provider, MD   BP 124/76 mmHg  Pulse 105  Temp(Src) 98.9 F (37.2 C) (Oral)  Resp 17  Ht 5\' 6"  (1.676 m)  Wt 150 lb (68.04 kg)  BMI 24.22 kg/m2  SpO2 99%   Physical Exam  Constitutional: He appears well-developed.  HENT:  Head: Normocephalic.  Eyes: Conjunctivae and EOM are normal. No scleral icterus.  Neck: Neck supple. No thyromegaly present.  Cardiovascular: Normal rate and regular rhythm.  Exam reveals no gallop and no friction rub.   No murmur heard. Pulmonary/Chest: No stridor. He has no wheezes. He has no rales. He exhibits no tenderness.  Abdominal: He exhibits no distension. There is no tenderness. There is no rebound.  Musculoskeletal: Normal range of motion. He exhibits edema.  1+ edema in bilateral ankles.   Lymphadenopathy:    He has no cervical adenopathy.  Neurological: He is alert. He exhibits normal muscle tone. Coordination normal.  Oriented to person.   Skin: No rash noted. No erythema.  Psychiatric: He has a normal mood and affect. His behavior is normal.    ED Course  Procedures (including critical care time) DIAGNOSTIC STUDIES: Oxygen Saturation is 99% on RA, normal by my interpretation.    COORDINATION OF CARE: 9:07 PM-Discussed treatment plan with family at bedside and family agreed to plan.     Labs Review Labs Reviewed - No data to display  Imaging Review No results found.   EKG Interpretation None      MDM   Final diagnoses:  None     Fever and weakness resolved.  Nl studies,  Pt to follow up with pcp   The chart was scribed for me under my  direct supervision.  I personally performed the history, physical, and medical decision making and all procedures in the evaluation of this patient.Bethann Berkshire, MD 01/19/15 [phone]

## 2015-01-26 DEATH — deceased

## 2015-02-01 DIAGNOSIS — Z1389 Encounter for screening for other disorder: Secondary | ICD-10-CM | POA: Diagnosis not present

## 2015-02-01 DIAGNOSIS — Z6823 Body mass index (BMI) 23.0-23.9, adult: Secondary | ICD-10-CM | POA: Diagnosis not present

## 2015-02-01 DIAGNOSIS — E1165 Type 2 diabetes mellitus with hyperglycemia: Secondary | ICD-10-CM | POA: Diagnosis not present

## 2015-02-21 ENCOUNTER — Inpatient Hospital Stay (HOSPITAL_COMMUNITY)
Admission: EM | Admit: 2015-02-21 | Discharge: 2015-02-24 | DRG: 689 | Disposition: A | Discharge status: Home Health | Payer: Medicare Other | Attending: Internal Medicine | Admitting: Internal Medicine

## 2015-02-21 ENCOUNTER — Encounter (HOSPITAL_COMMUNITY): Payer: Self-pay | Admitting: *Deleted

## 2015-02-21 DIAGNOSIS — N183 Chronic kidney disease, stage 3 (moderate): Secondary | ICD-10-CM | POA: Diagnosis present

## 2015-02-21 DIAGNOSIS — I129 Hypertensive chronic kidney disease with stage 1 through stage 4 chronic kidney disease, or unspecified chronic kidney disease: Secondary | ICD-10-CM | POA: Diagnosis present

## 2015-02-21 DIAGNOSIS — I69354 Hemiplegia and hemiparesis following cerebral infarction affecting left non-dominant side: Secondary | ICD-10-CM | POA: Diagnosis not present

## 2015-02-21 DIAGNOSIS — I10 Essential (primary) hypertension: Secondary | ICD-10-CM | POA: Diagnosis not present

## 2015-02-21 DIAGNOSIS — I638 Other cerebral infarction: Secondary | ICD-10-CM | POA: Diagnosis not present

## 2015-02-21 DIAGNOSIS — F0390 Unspecified dementia without behavioral disturbance: Secondary | ICD-10-CM | POA: Diagnosis present

## 2015-02-21 DIAGNOSIS — E780 Pure hypercholesterolemia: Secondary | ICD-10-CM | POA: Diagnosis not present

## 2015-02-21 DIAGNOSIS — Z87891 Personal history of nicotine dependence: Secondary | ICD-10-CM | POA: Diagnosis not present

## 2015-02-21 DIAGNOSIS — E119 Type 2 diabetes mellitus without complications: Secondary | ICD-10-CM | POA: Diagnosis not present

## 2015-02-21 DIAGNOSIS — M6281 Muscle weakness (generalized): Secondary | ICD-10-CM | POA: Diagnosis not present

## 2015-02-21 DIAGNOSIS — G9340 Encephalopathy, unspecified: Secondary | ICD-10-CM | POA: Diagnosis present

## 2015-02-21 DIAGNOSIS — E860 Dehydration: Secondary | ICD-10-CM | POA: Diagnosis present

## 2015-02-21 DIAGNOSIS — E1165 Type 2 diabetes mellitus with hyperglycemia: Secondary | ICD-10-CM | POA: Diagnosis not present

## 2015-02-21 DIAGNOSIS — G20 Parkinson's disease: Secondary | ICD-10-CM | POA: Diagnosis not present

## 2015-02-21 DIAGNOSIS — N390 Urinary tract infection, site not specified: Secondary | ICD-10-CM | POA: Diagnosis not present

## 2015-02-21 DIAGNOSIS — B9620 Unspecified Escherichia coli [E. coli] as the cause of diseases classified elsewhere: Secondary | ICD-10-CM | POA: Diagnosis not present

## 2015-02-21 DIAGNOSIS — R4182 Altered mental status, unspecified: Secondary | ICD-10-CM | POA: Diagnosis not present

## 2015-02-21 DIAGNOSIS — R1310 Dysphagia, unspecified: Secondary | ICD-10-CM | POA: Diagnosis present

## 2015-02-21 DIAGNOSIS — N3000 Acute cystitis without hematuria: Secondary | ICD-10-CM | POA: Diagnosis not present

## 2015-02-21 LAB — CBC
HCT: 33.5 % — ABNORMAL LOW (ref 39.0–52.0)
Hemoglobin: 11.3 g/dL — ABNORMAL LOW (ref 13.0–17.0)
MCH: 28.8 pg (ref 26.0–34.0)
MCHC: 33.7 g/dL (ref 30.0–36.0)
MCV: 85.2 fL (ref 78.0–100.0)
Platelets: 229 10*3/uL (ref 150–400)
RBC: 3.93 MIL/uL — ABNORMAL LOW (ref 4.22–5.81)
RDW: 14 % (ref 11.5–15.5)
WBC: 10.7 10*3/uL — ABNORMAL HIGH (ref 4.0–10.5)

## 2015-02-21 LAB — URINALYSIS, ROUTINE W REFLEX MICROSCOPIC
Bilirubin Urine: NEGATIVE
Glucose, UA: 250 mg/dL — AB
Ketones, ur: NEGATIVE mg/dL
Nitrite: POSITIVE — AB
PH: 6 (ref 5.0–8.0)
PROTEIN: 30 mg/dL — AB
SPECIFIC GRAVITY, URINE: 1.01 (ref 1.005–1.030)
Urobilinogen, UA: 0.2 mg/dL (ref 0.0–1.0)

## 2015-02-21 LAB — CBG MONITORING, ED
GLUCOSE-CAPILLARY: 222 mg/dL — AB (ref 65–99)
Glucose-Capillary: 140 mg/dL — ABNORMAL HIGH (ref 65–99)

## 2015-02-21 LAB — URINE MICROSCOPIC-ADD ON

## 2015-02-21 LAB — COMPREHENSIVE METABOLIC PANEL
ALT: 11 U/L — AB (ref 17–63)
ANION GAP: 11 (ref 5–15)
AST: 15 U/L (ref 15–41)
Albumin: 3.8 g/dL (ref 3.5–5.0)
Alkaline Phosphatase: 68 U/L (ref 38–126)
BUN: 25 mg/dL — AB (ref 6–20)
CO2: 21 mmol/L — AB (ref 22–32)
CREATININE: 1.57 mg/dL — AB (ref 0.61–1.24)
Calcium: 8.6 mg/dL — ABNORMAL LOW (ref 8.9–10.3)
Chloride: 103 mmol/L (ref 101–111)
GFR calc Af Amer: 44 mL/min — ABNORMAL LOW (ref >60)
GFR calc non Af Amer: 38 mL/min — ABNORMAL LOW (ref >60)
GLUCOSE: 243 mg/dL — AB (ref 65–99)
POTASSIUM: 4.1 mmol/L (ref 3.5–5.1)
Sodium: 135 mmol/L (ref 135–145)
Total Bilirubin: 0.6 mg/dL (ref 0.3–1.2)
Total Protein: 7.2 g/dL (ref 6.5–8.1)

## 2015-02-21 LAB — BRAIN NATRIURETIC PEPTIDE: B Natriuretic Peptide: 42 pg/mL (ref 0.0–100.0)

## 2015-02-21 LAB — TROPONIN I

## 2015-02-21 MED ORDER — ACETAMINOPHEN 500 MG PO TABS
1000.0000 mg | ORAL_TABLET | Freq: Once | ORAL | Status: AC
  Administered 2015-02-21: 1000 mg via ORAL
  Filled 2015-02-21: qty 2

## 2015-02-21 MED ORDER — SODIUM CHLORIDE 0.9 % IV BOLUS (SEPSIS)
500.0000 mL | Freq: Once | INTRAVENOUS | Status: AC
  Administered 2015-02-21: 500 mL via INTRAVENOUS

## 2015-02-21 MED ORDER — DEXTROSE 5 % IV SOLN
1.0000 g | Freq: Once | INTRAVENOUS | Status: AC
  Administered 2015-02-21: 1 g via INTRAVENOUS
  Filled 2015-02-21: qty 10

## 2015-02-21 NOTE — H&P (Signed)
PCP:   Colette Ribas, MD   Chief Complaint:  Altered mental status  HPI:  81 year old Hays who  has a past medical history of Dementia; Diabetes mellitus without complication; Hypertension; and UTI (lower urinary tract infection). Today was brought to the hospital by patient's family for altered mental status. Patient has been lethargic and not eating and drinking as well. Also patient was found to have fever so he was brought to the hospital. In the ED patient was found to have UTI and started on Rocephin. Patient is a poor historian and unable to provide any history due to underlying dementia.   Allergies:   Allergies  Allergen Reactions  . Sulfa Antibiotics Hives and Itching      Past Medical History  Diagnosis Date  . Dementia   . Diabetes mellitus without complication   . Hypertension   . UTI (lower urinary tract infection)     History reviewed. No pertinent past surgical history.  Prior to Admission medications   Medication Sig Start Date End Date Taking? Authorizing Provider  aspirin EC 325 MG tablet Take 325 mg by mouth every morning.   Yes Historical Provider, MD  atorvastatin (LIPITOR) 40 MG tablet Take 40 mg by mouth at bedtime. 02/18/14  Yes Historical Provider, MD  digoxin (LANOXIN) 0.125 MG tablet Take 0.125 mg by mouth daily.   Yes Historical Provider, MD  donepezil (ARICEPT) 10 MG tablet Take 10 mg by mouth at bedtime. 02/18/14  Yes Historical Provider, MD  escitalopram (LEXAPRO) 10 MG tablet Take 5 mg by mouth at bedtime.  12/19/14  Yes Historical Provider, MD  finasteride (PROSCAR) 5 MG tablet Take 5 mg by mouth daily.   Yes Historical Provider, MD  furosemide (LASIX) 20 MG tablet Take 20 mg by mouth daily.   Yes Historical Provider, MD  glipiZIDE-metformin (METAGLIP) 2.5-500 MG per tablet Take 0.5 tablets by mouth daily.    Yes Historical Provider, MD  lisinopril (PRINIVIL,ZESTRIL) 5 MG tablet Take 1 tablet by mouth daily. 10/23/14  Yes Historical  Provider, MD  megestrol (MEGACE) 40 MG tablet Take 40 mg by mouth every other day.    Yes Historical Provider, MD  nitrofurantoin (MACRODANTIN) 100 MG capsule Take 1 capsule by mouth daily. 10/31/14  Yes Historical Provider, MD  tamsulosin (FLOMAX) 0.4 MG CAPS capsule Take 0.4 mg by mouth at bedtime.   Yes Historical Provider, MD  trimethoprim (TRIMPEX) 100 MG tablet Take 100 mg by mouth at bedtime.    Yes Historical Provider, MD  cephALEXin (KEFLEX) 500 MG capsule Take 1 capsule (500 mg total) by mouth 4 (four) times daily. Patient not taking: Reported on 01/19/2015 12/22/14   Gilda Crease, MD  ciprofloxacin (CIPRO) 500 MG tablet Take 1 tablet (500 mg total) by mouth every 12 (twelve) hours. Patient not taking: Reported on 01/19/2015 12/23/14   Raeford Razor, MD    Social History:  reports that he has quit smoking. He does not have any smokeless tobacco history on file. He reports that he does not drink alcohol or use illicit drugs.  No family history on file.  Filed Weights   02/21/15 1730 02/21/15 2312  Weight: Richard.04 kg (150 lb) 70.4 kg (155 lb 3.3 oz)    All the positives are listed in BOLD  Review of Systems:  Unobtainable due to the shins underlying dementia   Physical Exam: Blood pressure 141/79, pulse 69, temperature 98.9 F (37.2 C), temperature source Oral, resp. rate 18, height 5\' 7"  (1.702  m), weight 70.4 kg (155 lb 3.3 oz), SpO2 95 %. Constitutional:   Patient is a well-developed and well-nourished Hays* in no acute distress and cooperative with exam. Head: Normocephalic and atraumatic Mouth: Mucus membranes moist Eyes: PERRL, EOMI, conjunctivae normal Neck: Supple, No Thyromegaly Cardiovascular: RRR, S1 normal, S2 normal Pulmonary/Chest: CTAB, no wheezes, rales, or rhonchi Abdominal: Soft. Non-tender, non-distended, bowel sounds are normal, no masses, organomegaly, or guarding present.  Neurological: Alert, not oriented 3 Extremities : No Cyanosis, Clubbing or  Edema  Labs on Admission:  Basic Metabolic Panel:  Recent Labs Lab 02/21/15 1748  NA 135  K 4.1  CL 103  CO2 21*  GLUCOSE 243*  BUN 25*  CREATININE 1.57*  CALCIUM 8.6*   Liver Function Tests:  Recent Labs Lab 02/21/15 1748  AST 15  ALT 11*  ALKPHOS Richard  BILITOT 0.6  PROT 7.2  ALBUMIN 3.8   No results for input(s): LIPASE, AMYLASE in the last 168 hours. No results for input(s): AMMONIA in the last 168 hours. CBC:  Recent Labs Lab 02/21/15 1748  WBC 10.7*  HGB 11.3*  HCT 33.5*  MCV 85.2  PLT 229   Cardiac Enzymes:  Recent Labs Lab 02/21/15 1748  TROPONINI <0.03    BNP (last 3 results)  Recent Labs  12/22/14 0127 02/21/15 1748  BNP 22.0 42.0    ProBNP (last 3 results) No results for input(s): PROBNP in the last 8760 hours.  CBG:  Recent Labs Lab 02/21/15 1729 02/21/15 2158  GLUCAP 222* 140*    Radiological Exams on Admission: No results found.   EKG- normal sinus rhythm  Assessment/Plan Active Problems:   Essential hypertension   UTI (lower urinary tract infection)   Urinary tract infection   C KD stage III  UTI We'll admit the patient, continue IV Rocephin Follow urine culture results  CKD stage III Patient is being creatinine is around his baseline Follow BMP in a.m.  Diabetes mellitus Hold oral hypoglycemic agents Start sliding scale insulin with NovoLog  Dementia No behavioral disturbance, stable  Code status: Full code  Family discussion: Admission, patients condition and plan of care including tests being ordered have been discussed with the patient's wife who indicate understanding and agree with the plan and Code Status.   Time Spent on Admission: 60 min  Dottie Vaquerano S [facility] Pager: [phone] 02/21/2015, 11:40 PM  If 7PM-7AM, please contact night-coverage  [email or web address]  Password [email or web address]

## 2015-02-21 NOTE — ED Notes (Signed)
CBG- 222 

## 2015-02-21 NOTE — ED Provider Notes (Signed)
CSN: [id number]     Arrival date & time 02/21/15  1716 History   First MD Initiated Contact with Patient 02/21/15 1745     Chief Complaint  Patient presents with  . Altered Mental Status     (Consider location/radiation/quality/duration/timing/severity/associated sxs/prior Treatment) HPI Comments: 81 year old male with past medical history including dementia, BPH complicated by multiple urinary tract infections, hypertension, CVA with left-sided weakness who presents with altered mental status. Wife states that the patient was doing okay yesterday but since this morning has been lying around in bed, not wanting to eat breakfast which is unusual for him. He has felt warm to the touch but they have not measured a temperature. He has baseline dementia but has been more altered than usual. No vomiting, diarrhea, or complaints of pain. Daughter states that the patient gets like this every time he has a urinary tract infection and usually improves after treatment. He is currently on daily Macrodantin as a preventive medication.  Patient is a 81 y.o. male presenting with altered mental status. The history is provided by the spouse and a relative. The history is limited by the condition of the patient.  Altered Mental Status   Past Medical History  Diagnosis Date  . Dementia   . Diabetes mellitus without complication   . Hypertension   . UTI (lower urinary tract infection)    History reviewed. No pertinent past surgical history. No family history on file. History  Substance Use Topics  . Smoking status: Former Games developer  . Smokeless tobacco: Not on file  . Alcohol Use: No    Review of Systems  10 Systems reviewed and are negative for acute change except as noted in the HPI.   Allergies  Sulfa antibiotics  Home Medications   Prior to Admission medications   Medication Sig Start Date End Date Taking? Authorizing Provider  aspirin EC 325 MG tablet Take 325 mg by mouth every morning.    Yes Historical Provider, MD  atorvastatin (LIPITOR) 40 MG tablet Take 40 mg by mouth at bedtime. 02/18/14  Yes Historical Provider, MD  digoxin (LANOXIN) 0.125 MG tablet Take 0.125 mg by mouth daily.   Yes Historical Provider, MD  donepezil (ARICEPT) 10 MG tablet Take 10 mg by mouth at bedtime. 02/18/14  Yes Historical Provider, MD  escitalopram (LEXAPRO) 10 MG tablet Take 5 mg by mouth at bedtime.  12/19/14  Yes Historical Provider, MD  finasteride (PROSCAR) 5 MG tablet Take 5 mg by mouth daily.   Yes Historical Provider, MD  furosemide (LASIX) 20 MG tablet Take 20 mg by mouth daily.   Yes Historical Provider, MD  glipiZIDE-metformin (METAGLIP) 2.5-500 MG per tablet Take 0.5 tablets by mouth daily.    Yes Historical Provider, MD  lisinopril (PRINIVIL,ZESTRIL) 5 MG tablet Take 1 tablet by mouth daily. 10/23/14  Yes Historical Provider, MD  megestrol (MEGACE) 40 MG tablet Take 40 mg by mouth every other day.    Yes Historical Provider, MD  nitrofurantoin (MACRODANTIN) 100 MG capsule Take 1 capsule by mouth daily. 10/31/14  Yes Historical Provider, MD  tamsulosin (FLOMAX) 0.4 MG CAPS capsule Take 0.4 mg by mouth at bedtime.   Yes Historical Provider, MD  trimethoprim (TRIMPEX) 100 MG tablet Take 100 mg by mouth at bedtime.    Yes Historical Provider, MD  cephALEXin (KEFLEX) 500 MG capsule Take 1 capsule (500 mg total) by mouth 4 (four) times daily. Patient not taking: Reported on 01/19/2015 12/22/14   Gilda Crease, MD  ciprofloxacin (CIPRO) 500 MG tablet Take 1 tablet (500 mg total) by mouth every 12 (twelve) hours. Patient not taking: Reported on 01/19/2015 12/23/14   Raeford Razor, MD   BP 118/72 mmHg  Pulse 77  Temp(Src) 99.3 F (37.4 C) (Rectal)  Resp 20  Ht  (1.702 m)  Wt 150 lb (68.04 kg)  BMI 23.49 kg/m2  SpO2 100% Physical Exam  Constitutional: He appears well-developed and well-nourished. No distress.  HENT:  Head: Normocephalic and atraumatic.  Moist mucous membranes   Eyes: Conjunctivae are normal. Pupils are equal, round, and reactive to light.  Neck: Neck supple.  Cardiovascular: Normal rate, regular rhythm and normal heart sounds.   No murmur heard. Pulmonary/Chest: Effort normal and breath sounds normal.  Abdominal: Soft. Bowel sounds are normal. He exhibits no distension. There is no tenderness.  Musculoskeletal:  Trace bilateral lower extremity edema to ankles  Neurological: He is alert. He exhibits normal muscle tone.  Oriented to person  Skin: Skin is warm and dry.  Psychiatric: He has a normal mood and affect.  quiet  Nursing note and vitals reviewed.   ED Course  Procedures (including critical care time) Labs Review Labs Reviewed  COMPREHENSIVE METABOLIC PANEL - Abnormal; Notable for the following:    CO2 21 (*)    Glucose, Bld 243 (*)    BUN 25 (*)    Creatinine, Ser 1.57 (*)    Calcium 8.6 (*)    ALT 11 (*)    GFR calc non Af Amer 38 (*)    GFR calc Af Amer 44 (*)    All other components within normal limits  CBC - Abnormal; Notable for the following:    WBC 10.7 (*)    RBC 3.93 (*)    Hemoglobin 11.3 (*)    HCT 33.5 (*)    All other components within normal limits  URINALYSIS, ROUTINE W REFLEX MICROSCOPIC (NOT AT [facility]) - Abnormal; Notable for the following:    APPearance CLOUDY (*)    Glucose, UA 250 (*)    Hgb urine dipstick LARGE (*)    Protein, ur 30 (*)    Nitrite POSITIVE (*)    Leukocytes, UA MODERATE (*)    All other components within normal limits  URINE MICROSCOPIC-ADD ON - Abnormal; Notable for the following:    Squamous Epithelial / LPF FEW (*)    Bacteria, UA MANY (*)    All other components within normal limits  CBG MONITORING, ED - Abnormal; Notable for the following:    Glucose-Capillary 222 (*)    All other components within normal limits  URINE CULTURE  TROPONIN I  BRAIN NATRIURETIC PEPTIDE    Imaging Review No results found.   EKG Interpretation None      MDM   Final diagnoses:   None  Altered mental status UTI H/o dementia H/o BPH with urinary retention  81yo M w/ frequent UTIs and dementia p/w one day of altered behavior. No new focal neurologic deficits on exam. Vital signs notable for temperature 100.3. EKG on arrival shows no ischemic changes. Obtained labs listed above. Gave pt tylenol and IVF bolus.  Lab work shows hyperglycemia, stable chronic kidney disease, and a UTI consistent with infection. Urine culture sent and pt given ceftriaxone. Patient remains nonverbal and altered from his baseline. I considered intracranial process, however the patient has obvious infectious source and family reports that this same presentation has occurred multiple times with previous UTIs. No new neurologic deficits or  neck stiffness. Given his ongoing symptoms and risk for worsening infection due to his chronic urinary retention and advanced age, he will be admitted for antibiotic treatment and monitoring for resolution of his symptoms.   Laurence Spates, MD 02/21/15 2114

## 2015-02-21 NOTE — ED Notes (Signed)
Wife states that pt has been lying around today. Pt is warm to the touch. Wife states that pt has had allergy symptoms lately but denies any major illnesses. Hx of UTIs.

## 2015-02-22 DIAGNOSIS — G9340 Encephalopathy, unspecified: Secondary | ICD-10-CM

## 2015-02-22 DIAGNOSIS — N3000 Acute cystitis without hematuria: Secondary | ICD-10-CM

## 2015-02-22 LAB — COMPREHENSIVE METABOLIC PANEL
ALBUMIN: 3.5 g/dL (ref 3.5–5.0)
ALT: 10 U/L — ABNORMAL LOW (ref 17–63)
ANION GAP: 7 (ref 5–15)
AST: 13 U/L — AB (ref 15–41)
Alkaline Phosphatase: 62 U/L (ref 38–126)
BUN: 21 mg/dL — ABNORMAL HIGH (ref 6–20)
CO2: 22 mmol/L (ref 22–32)
Calcium: 8.7 mg/dL — ABNORMAL LOW (ref 8.9–10.3)
Chloride: 108 mmol/L (ref 101–111)
Creatinine, Ser: 1.25 mg/dL — ABNORMAL HIGH (ref 0.61–1.24)
GFR calc Af Amer: 58 mL/min — ABNORMAL LOW (ref >60)
GFR, EST NON AFRICAN AMERICAN: 50 mL/min — AB (ref >60)
GLUCOSE: 163 mg/dL — AB (ref 65–99)
POTASSIUM: 4.2 mmol/L (ref 3.5–5.1)
SODIUM: 137 mmol/L (ref 135–145)
Total Bilirubin: 1 mg/dL (ref 0.3–1.2)
Total Protein: 6.8 g/dL (ref 6.5–8.1)

## 2015-02-22 LAB — CBC
HEMATOCRIT: 35.7 % — AB (ref 39.0–52.0)
HEMOGLOBIN: 12 g/dL — AB (ref 13.0–17.0)
MCH: 28.8 pg (ref 26.0–34.0)
MCHC: 33.6 g/dL (ref 30.0–36.0)
MCV: 85.8 fL (ref 78.0–100.0)
Platelets: 238 10*3/uL (ref 150–400)
RBC: 4.16 MIL/uL — AB (ref 4.22–5.81)
RDW: 13.5 % (ref 11.5–15.5)
WBC: 12.1 10*3/uL — ABNORMAL HIGH (ref 4.0–10.5)

## 2015-02-22 LAB — GLUCOSE, CAPILLARY
GLUCOSE-CAPILLARY: 158 mg/dL — AB (ref 65–99)
Glucose-Capillary: 167 mg/dL — ABNORMAL HIGH (ref 65–99)
Glucose-Capillary: 133 mg/dL — ABNORMAL HIGH (ref 65–99)
Glucose-Capillary: 235 mg/dL — ABNORMAL HIGH (ref 65–99)

## 2015-02-22 MED ORDER — INSULIN ASPART 100 UNIT/ML ~~LOC~~ SOLN
0.0000 [IU] | Freq: Three times a day (TID) | SUBCUTANEOUS | Status: DC
Start: 2015-02-22 — End: 2015-02-23
  Administered 2015-02-22: 2 [IU] via SUBCUTANEOUS
  Administered 2015-02-22: 1 [IU] via SUBCUTANEOUS
  Administered 2015-02-22: 2 [IU] via SUBCUTANEOUS
  Administered 2015-02-23: 3 [IU] via SUBCUTANEOUS
  Administered 2015-02-23: 2 [IU] via SUBCUTANEOUS

## 2015-02-22 MED ORDER — ACETAMINOPHEN 650 MG RE SUPP: 650.0000 mg | Freq: Four times a day (QID) | RECTAL | Status: DC | PRN

## 2015-02-22 MED ORDER — CEFTRIAXONE SODIUM IN DEXTROSE 20 MG/ML IV SOLN
1.0000 g | INTRAVENOUS | Status: DC
  Filled 2015-02-22: qty 50

## 2015-02-22 MED ORDER — ESCITALOPRAM OXALATE 10 MG PO TABS
5.0000 mg | ORAL_TABLET | Freq: Every day | ORAL | Status: DC
  Administered 2015-02-22 – 2015-02-23 (×2): 5 mg via ORAL
  Filled 2015-02-22 (×3): qty 1

## 2015-02-22 MED ORDER — DEXTROSE 5 % IV SOLN
1.0000 g | INTRAVENOUS | Status: DC
  Administered 2015-02-22 – 2015-02-23 (×2): 1 g via INTRAVENOUS
  Filled 2015-02-22 (×5): qty 10

## 2015-02-22 MED ORDER — ASPIRIN EC 325 MG PO TBEC
325.0000 mg | DELAYED_RELEASE_TABLET | Freq: Every morning | ORAL | Status: DC
  Administered 2015-02-22 – 2015-02-24 (×3): 325 mg via ORAL
  Filled 2015-02-22 (×3): qty 1

## 2015-02-22 MED ORDER — DONEPEZIL HCL 5 MG PO TABS
10.0000 mg | ORAL_TABLET | Freq: Every day | ORAL | Status: DC
  Administered 2015-02-22 – 2015-02-23 (×2): 10 mg via ORAL
  Filled 2015-02-22 (×3): qty 2

## 2015-02-22 MED ORDER — ATORVASTATIN CALCIUM 40 MG PO TABS
40.0000 mg | ORAL_TABLET | Freq: Every day | ORAL | Status: DC
  Administered 2015-02-22 – 2015-02-23 (×2): 40 mg via ORAL
  Filled 2015-02-22 (×4): qty 1

## 2015-02-22 MED ORDER — FINASTERIDE 5 MG PO TABS
5.0000 mg | ORAL_TABLET | Freq: Every day | ORAL | Status: DC
  Administered 2015-02-22 – 2015-02-24 (×3): 5 mg via ORAL
  Filled 2015-02-22 (×5): qty 1

## 2015-02-22 MED ORDER — SODIUM CHLORIDE 0.9 % IV SOLN
INTRAVENOUS | Status: DC
  Administered 2015-02-22: 75 mL/h via INTRAVENOUS
  Administered 2015-02-22 – 2015-02-23 (×2): via INTRAVENOUS

## 2015-02-22 MED ORDER — ONDANSETRON HCL 4 MG PO TABS: 4.0000 mg | ORAL_TABLET | Freq: Four times a day (QID) | ORAL | Status: DC | PRN

## 2015-02-22 MED ORDER — ONDANSETRON HCL 4 MG/2ML IJ SOLN: 4.0000 mg | Freq: Four times a day (QID) | INTRAMUSCULAR | Status: DC | PRN

## 2015-02-22 MED ORDER — DIGOXIN 125 MCG PO TABS
0.1250 mg | ORAL_TABLET | Freq: Every day | ORAL | Status: DC
  Administered 2015-02-22 – 2015-02-24 (×3): 0.125 mg via ORAL
  Filled 2015-02-22 (×3): qty 1

## 2015-02-22 MED ORDER — ACETAMINOPHEN 325 MG PO TABS
650.0000 mg | ORAL_TABLET | Freq: Four times a day (QID) | ORAL | Status: DC | PRN
  Administered 2015-02-22: 650 mg via ORAL
  Filled 2015-02-22: qty 2

## 2015-02-22 MED ORDER — ENOXAPARIN SODIUM 40 MG/0.4ML ~~LOC~~ SOLN
40.0000 mg | SUBCUTANEOUS | Status: DC
Start: 2015-02-22 — End: 2015-02-24
  Administered 2015-02-22 – 2015-02-24 (×3): 40 mg via SUBCUTANEOUS
  Filled 2015-02-22 (×3): qty 0.4

## 2015-02-22 NOTE — Progress Notes (Signed)
ANTIBIOTIC CONSULT NOTE - INITIAL  Pharmacy Consult for ceftriaxone Indication: UTI  Allergies  Allergen Reactions  . Sulfa Antibiotics Hives and Itching    Patient Measurements: Height: 5\' 7"  (170.2 cm) Weight: 155 lb 3.3 oz (70.4 kg) IBW/kg (Calculated) : 66.1   Vital Signs: Temp: 99.6 F (37.6 C) (07/28 0629) Temp Source: Oral (07/28 0629) BP: 132/65 mmHg (07/28 0629) Pulse Rate: 69 (07/28 0629) Intake/Output from previous day: 07/27 0701 - 07/28 0700 In: -  Out: 150 [Urine:150] Intake/Output from this shift:    Labs:  Recent Labs  02/21/15 1748 02/22/15 0622  WBC 10.7* 12.1*  HGB 11.3* 12.0*  PLT 229 238  CREATININE 1.57* 1.25*   Estimated Creatinine Clearance: 39.7 mL/min (by C-G formula based on Cr of 1.25). No results for input(s): VANCOTROUGH, VANCOPEAK, VANCORANDOM, GENTTROUGH, GENTPEAK, GENTRANDOM, TOBRATROUGH, TOBRAPEAK, TOBRARND, AMIKACINPEAK, AMIKACINTROU, AMIKACIN in the last 72 hours.   Microbiology: Recent Results (from the past 720 hour(s))  Urine culture     Status: None (Preliminary result)   Collection Time: 02/21/15  6:55 PM  Result Value Ref Range Status   Specimen Description URINE, CATHETERIZED  Final   Special Requests NONE  Final   Culture   Final    CULTURE REINCUBATED FOR BETTER GROWTH Performed at [facility]    Report Status PENDING  Incomplete    Medical History: Past Medical History  Diagnosis Date  . Dementia   . Diabetes mellitus without complication   . Hypertension   . UTI (lower urinary tract infection)     Medications:  Prescriptions prior to admission  Medication Sig Dispense Refill Last Dose  . aspirin EC 325 MG tablet Take 325 mg by mouth every morning.   02/21/2015 at Unknown time  . atorvastatin (LIPITOR) 40 MG tablet Take 40 mg by mouth at bedtime.   02/20/2015 at Unknown time  . digoxin (LANOXIN) 0.125 MG tablet Take 0.125 mg by mouth daily.   02/21/2015 at Unknown time  . donepezil (ARICEPT) 10  MG tablet Take 10 mg by mouth at bedtime.   02/20/2015 at Unknown time  . escitalopram (LEXAPRO) 10 MG tablet Take 5 mg by mouth at bedtime.    02/20/2015 at Unknown time  . finasteride (PROSCAR) 5 MG tablet Take 5 mg by mouth daily.   02/21/2015 at Unknown time  . furosemide (LASIX) 20 MG tablet Take 20 mg by mouth daily.   02/21/2015 at Unknown time  . glipiZIDE-metformin (METAGLIP) 2.5-500 MG per tablet Take 0.5 tablets by mouth daily.    02/21/2015 at Unknown time  . lisinopril (PRINIVIL,ZESTRIL) 5 MG tablet Take 1 tablet by mouth daily.   02/21/2015 at Unknown time  . megestrol (MEGACE) 40 MG tablet Take 40 mg by mouth every other day.    02/20/2015 at Unknown time  . nitrofurantoin (MACRODANTIN) 100 MG capsule Take 1 capsule by mouth daily.   Past Month at Unknown time  . tamsulosin (FLOMAX) 0.4 MG CAPS capsule Take 0.4 mg by mouth at bedtime.   02/20/2015 at Unknown time  . trimethoprim (TRIMPEX) 100 MG tablet Take 100 mg by mouth at bedtime.    02/20/2015 at Unknown time  . cephALEXin (KEFLEX) 500 MG capsule Take 1 capsule (500 mg total) by mouth 4 (four) times daily. (Patient not taking: Reported on 01/19/2015) 40 capsule 0 12/22/2014  . ciprofloxacin (CIPRO) 500 MG tablet Take 1 tablet (500 mg total) by mouth every 12 (twelve) hours. (Patient not taking: Reported on 01/19/2015) 10 tablet  0    Assessment: 81 yo man on ceftriaxone for UTI.  UC pending.  Goal of Therapy:  Eradication of infection  Plan:  Cont rocephin 1 gm IV q24 hours.  Pharmacy will sign off.  Please advise if we can be of further assiatance  Exodus Kutzer Poteet 02/22/2015,1:15 PM

## 2015-02-22 NOTE — Progress Notes (Signed)
[facility] PROGRESS NOTE  Richard Hays YNW:[id number] DOB: 11/17/1925 DOA: 02/21/2015 PCP: Colette Ribas, MD  Assessment/Plan: Acute encephalopathy -Presumed secondary to UTI and dehydration. -Per wife he is close to his baseline.  UTI -Continue Rocephin pending culture data.  Chronic kidney disease stage III -Creatinine is better than his baseline at 1.2, it appears baseline is around 1.6.  Diabetes mellitus -Fair control, continue current regimen.  Dementia -No behavioral disturbances, stable.  Code Status: Full code Family Communication: Discussed with wife Dorothy at bedside  Disposition Plan: Home when ready, anticipate 24-48 hours   Consultants:  None   Antibiotics:  Rocephin   Subjective: Awake, no overnight events, chest pain or shortness of breath  Objective: Filed Vitals:   02/21/15 2200 02/21/15 2312 02/22/15 0629 02/22/15 1500  BP: 126/67 141/79 132/65 122/67  Pulse: 63 69 69 77  Temp:  98.9 F (37.2 C) 99.6 F (37.6 C) 98.1 F (36.7 C)  TempSrc:  Oral Oral Oral  Resp: Height:      Weight:  70.4 kg (155 lb 3.3 oz)    SpO2: 100% 95% 100% 100%    Intake/Output Summary (Last 24 hours) at 02/22/15 1654 Last data filed at 02/22/15 1503  Gross per 24 hour  Intake    120 ml  Output    150 ml  Net    -30 ml   Filed Weights   02/21/15 1730 02/21/15 2312  Weight: 68.04 kg (150 lb) 70.4 kg (155 lb 3.3 oz)    Exam:   General:  Awake, alert  Cardiovascular: Regular rate and rhythm  Respiratory: Clear to auscultation bilaterally  Abdomen: Soft, nontender, nondistended, positive bowel sounds  Extremities: No clubbing, cyanosis or edema   Neurologic:  Grossly intact, nonfocal  Data Reviewed: Basic Metabolic Panel:  Recent Labs Lab 02/21/15 1748 02/22/15 0622  NA 135 137  K 4.1 4.2  CL 103 108  CO2 21* 22  GLUCOSE 243* 163*  BUN 25* 21*  CREATININE 1.57* 1.25*  CALCIUM 8.6* 8.7*   Liver Function  Tests:  Recent Labs Lab 02/21/15 1748 02/22/15 0622  AST 15 13*  ALT 11* 10*  ALKPHOS 68 62  BILITOT 0.6 1.0  PROT 7.2 6.8  ALBUMIN 3.8 3.5   No results for input(s): LIPASE, AMYLASE in the last 168 hours. No results for input(s): AMMONIA in the last 168 hours. CBC:  Recent Labs Lab 02/21/15 1748 02/22/15 0622  WBC 10.7* 12.1*  HGB 11.3* 12.0*  HCT 33.5* 35.7*  MCV 85.2 85.8  PLT 229 238   Cardiac Enzymes:  Recent Labs Lab 02/21/15 1748  TROPONINI <0.03   BNP (last 3 results)  Recent Labs  12/22/14 0127 02/21/15 1748  BNP 22.0 42.0    ProBNP (last 3 results) No results for input(s): PROBNP in the last 8760 hours.  CBG:  Recent Labs Lab 02/21/15 1729 02/21/15 2158 02/22/15 0746 02/22/15 1123 02/22/15 1614  GLUCAP 222* 140* 167* 133* 158*    Recent Results (from the past 240 hour(s))  Urine culture     Status: None (Preliminary result)   Collection Time: 02/21/15  6:55 PM  Result Value Ref Range Status   Specimen Description URINE, CATHETERIZED  Final   Special Requests NONE  Final   Culture   Final    CULTURE REINCUBATED FOR BETTER GROWTH Performed at [facility]    Report Status PENDING  Incomplete     Studies: No results found.  Scheduled Meds: . aspirin EC  325 mg Oral q morning - 10a  . atorvastatin  40 mg Oral QHS  . cefTRIAXone (ROCEPHIN)  IV  1 g Intravenous Q24H  . digoxin  0.125 mg Oral Daily  . donepezil  10 mg Oral QHS  . enoxaparin (LOVENOX) injection  40 mg Subcutaneous Q24H  . escitalopram  5 mg Oral QHS  . finasteride  5 mg Oral Daily  . insulin aspart  0-9 Units Subcutaneous TID WC   Continuous Infusions: . sodium chloride 75 mL/hr at 02/22/15 1505    Active Problems:   Essential hypertension   UTI (lower urinary tract infection)   Urinary tract infection    Time spent: 30 minutes. Greater than 50% of this time was spent in direct contact with the patient coordinating care.    Chaya Jan  [facility] Pager [phone]  If 7PM-7AM, please contact night-coverage at [email or web address], password [email or web address] 02/22/2015, 4:54 PM  LOS: 1 day

## 2015-02-22 NOTE — Care Management Note (Signed)
Case Management Note  Patient Details  Name: Richard Hays MRN: [id number] Date of Birth: 10-16-1925  Subjective/Objective:                  Pt admitted from home with UTi. Pt lives with his wife and will return home at discharge. Pt has a cane and walker for home use. Pts wife stated that she helps pt out a lot with ADL's.  Action/Plan: Awaiting PT consult. Will continue to follow for discharge planning needs.  Expected Discharge Date:                  Expected Discharge Plan:  Home w Home Health Services  In-House Referral:  NA  Discharge planning Services  CM Consult  Post Acute Care Choice:  Home Health Choice offered to:  Spouse  DME Arranged:    DME Agency:     HH Arranged:    HH Agency:     Status of Service:  In process, will continue to follow  Medicare Important Message Given:    Date Medicare IM Given:    Medicare IM give by:    Date Additional Medicare IM Given:    Additional Medicare Important Message give by:     If discussed at Long Length of Stay Meetings, dates discussed:    Additional Comments:  Cheryl Flash, RN 02/22/2015, 1:50 PM

## 2015-02-23 DIAGNOSIS — G9340 Encephalopathy, unspecified: Secondary | ICD-10-CM

## 2015-02-23 DIAGNOSIS — N390 Urinary tract infection, site not specified: Principal | ICD-10-CM

## 2015-02-23 DIAGNOSIS — I10 Essential (primary) hypertension: Secondary | ICD-10-CM

## 2015-02-23 LAB — CBC
HEMATOCRIT: 36 % — AB (ref 39.0–52.0)
Hemoglobin: 12 g/dL — ABNORMAL LOW (ref 13.0–17.0)
MCH: 28.6 pg (ref 26.0–34.0)
MCHC: 33.3 g/dL (ref 30.0–36.0)
MCV: 85.9 fL (ref 78.0–100.0)
Platelets: 222 10*3/uL (ref 150–400)
RBC: 4.19 MIL/uL — AB (ref 4.22–5.81)
RDW: 14.2 % (ref 11.5–15.5)
WBC: 11.6 10*3/uL — ABNORMAL HIGH (ref 4.0–10.5)

## 2015-02-23 LAB — BASIC METABOLIC PANEL
ANION GAP: 7 (ref 5–15)
BUN: 20 mg/dL (ref 6–20)
CALCIUM: 8.5 mg/dL — AB (ref 8.9–10.3)
CO2: 20 mmol/L — ABNORMAL LOW (ref 22–32)
Chloride: 109 mmol/L (ref 101–111)
Creatinine, Ser: 1.38 mg/dL — ABNORMAL HIGH (ref 0.61–1.24)
GFR calc Af Amer: 52 mL/min — ABNORMAL LOW (ref >60)
GFR calc non Af Amer: 45 mL/min — ABNORMAL LOW (ref >60)
GLUCOSE: 173 mg/dL — AB (ref 65–99)
POTASSIUM: 4.1 mmol/L (ref 3.5–5.1)
Sodium: 136 mmol/L (ref 135–145)

## 2015-02-23 LAB — GLUCOSE, CAPILLARY
GLUCOSE-CAPILLARY: 180 mg/dL — AB (ref 65–99)
GLUCOSE-CAPILLARY: 90 mg/dL (ref 65–99)
Glucose-Capillary: 165 mg/dL — ABNORMAL HIGH (ref 65–99)
Glucose-Capillary: 233 mg/dL — ABNORMAL HIGH (ref 65–99)

## 2015-02-23 LAB — HEMOGLOBIN A1C
HEMOGLOBIN A1C: 8 % — AB (ref 4.8–5.6)
Mean Plasma Glucose: 183 mg/dL

## 2015-02-23 MED ORDER — INSULIN ASPART 100 UNIT/ML ~~LOC~~ SOLN
0.0000 [IU] | Freq: Three times a day (TID) | SUBCUTANEOUS | Status: DC
  Administered 2015-02-23: 3 [IU] via SUBCUTANEOUS

## 2015-02-23 MED ORDER — INSULIN ASPART 100 UNIT/ML ~~LOC~~ SOLN
4.0000 [IU] | Freq: Three times a day (TID) | SUBCUTANEOUS | Status: DC
  Administered 2015-02-23: 4 [IU] via SUBCUTANEOUS

## 2015-02-23 NOTE — Evaluation (Addendum)
Clinical/Bedside Swallow Evaluation Patient Details  Name: Richard Hays MRN: [id number] Date of Birth: 03-13-1926  Today's Date: 02/23/2015 Time: SLP Start Time (ACUTE ONLY): 1405 SLP Stop Time (ACUTE ONLY): 1420 SLP Time Calculation (min) (ACUTE ONLY): 15 min  Past Medical History:  Past Medical History  Diagnosis Date  . Dementia   . Diabetes mellitus without complication   . Hypertension   . UTI (lower urinary tract infection)    Past Surgical History: History reviewed. No pertinent past surgical history. HPI:  Pt is an 81 y.o. male with PMH of Dementia; Diabetes mellitus without complication; Hypertension; and UTI (lower urinary tract infection). Admitted 7/27 for altered mental status- pt had been lethargic and not eating/ drinking well, also found to have fever. In ED pt found to have UTI. This a.m., RN reported pt was holding pills in mouth. CXR 6/24 revealed no acute abnormalities. Pt currently on regular diet. Bedside swallow eval ordered to assess swallow function.   Assessment / Plan / Recommendation Clinical Impression  Pt demonstrated no overt s/s of aspiration. Swallow appeared timely with adequate hyolaryngeal excursion. Pt did demonstrate brief oral holding following trial of solid- brief episode of decreased alertness but was easily aroused and redirected to bolus. Wife at bedside reported feeling that difficulty with pills this a.m. may have been d/t pt not being used to taking pills that way (whole with puree)- reported he usually takes them with liquid without difficulty. CXR 7/24 was clear. Aspiration risk appears mild at this time but increased when not fully alert/ attentive to meal or meds. Recommend downgrading diet to dysphagia 3 for ease of mastication, thin liquids, try meds whole with liquid and if difficulties arise, crush in puree. Full supervision during meals to ensure pt is alert and seated upright, allow pt to self feed as much as possible, cue for small  bites/ sips. SLP will f/u x1 for diet tolerance/ education.    Aspiration Risk  Mild    Diet Recommendation Dysphagia 3 (Mech soft);Thin   Medication Administration: Whole meds with liquid Compensations: Slow rate;Small sips/bites    Other  Recommendations Oral Care Recommendations: Oral care BID   Follow Up Recommendations       Frequency and Duration min 1 x/week  1 week   Pertinent Vitals/Pain none    SLP Swallow Goals     Swallow Study Prior Functional Status       General Other Pertinent Information: Pt is an 81 y.o. male with PMH of Dementia; Diabetes mellitus without complication; Hypertension; and UTI (lower urinary tract infection). Admitted 7/27 for altered mental status- pt had been lethargic and not eating/ drinking well, also found to have fever. In ED pt found to have UTI. This a.m., RN reported pt was holding pills in mouth. CXR 6/24 revealed now acute abnormalities. Pt currently on regular diet. Bedside swallow eval ordered to assess swallow function. Type of Study: Bedside swallow evaluation Diet Prior to this Study: Regular;Thin liquids Temperature Spikes Noted: Yes (low grade) Respiratory Status: Room air History of Recent Intubation: No Behavior/Cognition: Alert;Cooperative;Lethargic/Drowsy Oral Cavity - Dentition: Missing dentition Self-Feeding Abilities: Needs assist Patient Positioning: Upright in bed Baseline Vocal Quality: Normal    Oral/Motor/Sensory Function Overall Oral Motor/Sensory Function: Appears within functional limits for tasks assessed Labial ROM: Within Functional Limits Labial Symmetry: Within Functional Limits Labial Strength: Within Functional Limits Labial Sensation: Within Functional Limits Lingual ROM: Within Functional Limits Lingual Symmetry: Within Functional Limits Lingual Strength: Within Functional Limits Lingual Sensation: Within Functional  Limits   Ice Chips Ice chips: Not tested   Thin Liquid Thin Liquid: Within  functional limits Presentation: Straw    Nectar Thick Nectar Thick Liquid: Not tested   Honey Thick Honey Thick Liquid: Not tested   Puree Puree: Within functional limits Presentation: Spoon   Solid   GO    Solid: Impaired Oral Phase Impairments: Poor awareness of bolus;Impaired mastication Oral Phase Functional Implications: Oral holding       O[place]ak, Amy K, MA, CCC-SLP 02/23/2015,2:21 PM

## 2015-02-23 NOTE — Care Management Note (Signed)
Case Management Note  Patient Details  Name: Richard Hays MRN: [id number] Date of Birth: [birth year]  Subjective/Objective:                    Action/Plan:   Expected Discharge Date:                  Expected Discharge Plan:  Home w Home Health Services  In-House Referral:  NA  Discharge planning Services  CM Consult  Post Acute Care Choice:  Home Health Choice offered to:  Spouse  DME Arranged:  Wheelchair manual, Hospital bed DME Agency:  [facility]  HH Arranged:  RN, PT, Nurse's Aide [facility] Agency:  [facility]  Status of Service:  Completed, signed off  Medicare Important Message Given:  Yes-second notification given Date Medicare IM Given:    Medicare IM give by:    Date Additional Medicare IM Given:    Additional Medicare Important Message give by:     If discussed at Long Length of Stay Meetings, dates discussed:    Additional Comments: PT recommends HH PT at discharge. Pt would also benefit from [facility] RN and aide. Pt also needs w/c and hospital bed. Wife has chosen [facility] for [facility] and DME. Emma with [facility] is aware and will collect pts information from the chart. HH services to start within 48 hours of discharge. W/C to be delivered to pts room prior to discharge. Pt and pts nurse aware of discharge arrangements. Arlyss Queen Powell, RN 02/23/2015, 3:02 PM

## 2015-02-23 NOTE — Care Management Important Message (Signed)
Important Message  Patient Details  Name: Mcihael Hinderman MRN: [id number] Date of Birth: 15-Jun-1926   Medicare Important Message Given:  Yes-second notification given    Cheryl Flash, RN 02/23/2015, 7:41 AM

## 2015-02-23 NOTE — Progress Notes (Signed)
Patient seems to have trouble swallowing. Cannot tell if its physiological or neurological. I put the pills in patients mouth and offered water and he held them in his mouth. I then gave a tsp of pudding with the pills and he held it in his mouth still. His wife then gave him water with a straw and coerce him to swallow, which he did but coughed afterwards. Wife has concerns patient is having difficulty swallowing. I observed the patient coughing laying down so I raised the head of the bed.

## 2015-02-23 NOTE — Progress Notes (Signed)
[facility] PROGRESS NOTE  Jashua Knaak ZOX:[id number] DOB: [birth year] DOA: 02/21/2015 PCP: Colette Ribas, MD  Assessment/Plan: Acute encephalopathy -Presumed secondary to UTI and dehydration. -Today he is very drowsy, wife says he did not have a good night sleep and spent most of the night awake. As of yesterday he was back to his baseline mentation. -He does have baseline dementia.  UTI -Continue Rocephin pending culture data.  Dysphagia -Wife and nursing staff have noted difficulty swallowing with throat clearing and coughing as well as pocketing pills. -Suspect most of these changes are due to dementia, have requested speech therapy evaluation.  Chronic kidney disease stage III -Creatinine is at baseline.  Diabetes mellitus -CBGs are elevated. -We'll increase sliding scale to moderate and abnormal cartilage.  Code Status: Full code Family Communication: Wife Dorothy at bedside updated on plan of care  Disposition Plan: Home when ready, anticipate 24-48 hours. Discussed skilled nursing facility, however wife is hesitant for him to go to rehabilitation facility at this venue, probably arrange home health services upon discharge.   Consultants:  None   Antibiotics:  Rocephin   Subjective: Drowsy, difficult to arouse, per wife he was awake all night long  Objective: Filed Vitals:   02/22/15 1500 02/22/15 2238 02/23/15 0514 02/23/15 0916  BP: 122/67 165/80 141/66   Pulse: 77 96 82 87  Temp: 98.1 F (36.7 C) 99.6 F (37.6 C) 99.6 F (37.6 C)   TempSrc: Oral Oral Oral   Resp: 18 16 14    Height:      Weight:      SpO2: 100% 100% 97%     Intake/Output Summary (Last 24 hours) at 02/23/15 1348 Last data filed at 02/23/15 0915  Gross per 24 hour  Intake    480 ml  Output    325 ml  Net    155 ml   Filed Weights   02/21/15 1730 02/21/15 2312  Weight: 68.04 kg (150 lb) 70.4 kg (155 lb 3.3 oz)    Exam:   General:   Drowsy  Cardiovascular: Regular rate and rhythm  Respiratory: Clear to auscultation bilaterally  Abdomen: Soft, nontender, nondistended  Extremities: Trace bilateral edema   Neurologic:  Difficult to fully assess given current mental state  Data Reviewed: Basic Metabolic Panel:  Recent Labs Lab 02/21/15 1748 02/22/15 0622 02/23/15 0609  NA 135 137 136  K 4.1 4.2 4.1  CL 103 108 109  CO2 21* 22 20*  GLUCOSE 243* 163* 173*  BUN 25* 21* 20  CREATININE 1.57* 1.25* 1.38*  CALCIUM 8.6* 8.7* 8.5*   Liver Function Tests:  Recent Labs Lab 02/21/15 1748 02/22/15 0622  AST 15 13*  ALT 11* 10*  ALKPHOS 68 62  BILITOT 0.6 1.0  PROT 7.2 6.8  ALBUMIN 3.8 3.5   No results for input(s): LIPASE, AMYLASE in the last 168 hours. No results for input(s): AMMONIA in the last 168 hours. CBC:  Recent Labs Lab 02/21/15 1748 02/22/15 0622 02/23/15 0609  WBC 10.7* 12.1* 11.6*  HGB 11.3* 12.0* 12.0*  HCT 33.5* 35.7* 36.0*  MCV 85.2 85.8 85.9  PLT 229 238 222   Cardiac Enzymes:  Recent Labs Lab 02/21/15 1748  TROPONINI <0.03   BNP (last 3 results)  Recent Labs  12/22/14 0127 02/21/15 1748  BNP 22.0 42.0    ProBNP (last 3 results) No results for input(s): PROBNP in the last 8760 hours.  CBG:  Recent Labs Lab 02/22/15 1123 02/22/15 1614 02/22/15 2234 02/23/15  1610 02/23/15 1128  GLUCAP 133* 158* 235* 165* 233*    Recent Results (from the past 240 hour(s))  Urine culture     Status: None (Preliminary result)   Collection Time: 02/21/15  6:55 PM  Result Value Ref Range Status   Specimen Description URINE, CATHETERIZED  Final   Special Requests NONE  Final   Culture   Final    CULTURE REINCUBATED FOR BETTER GROWTH Performed at [facility]    Report Status PENDING  Incomplete     Studies: No results found.  Scheduled Meds: . aspirin EC  325 mg Oral q morning - 10a  . atorvastatin  40 mg Oral QHS  . cefTRIAXone (ROCEPHIN)  IV  1 g  Intravenous Q24H  . digoxin  0.125 mg Oral Daily  . donepezil  10 mg Oral QHS  . enoxaparin (LOVENOX) injection  40 mg Subcutaneous Q24H  . escitalopram  5 mg Oral QHS  . finasteride  5 mg Oral Daily  . insulin aspart  0-9 Units Subcutaneous TID WC   Continuous Infusions: . sodium chloride 75 mL/hr at 02/22/15 1505    Principal Problem:   Acute encephalopathy Active Problems:   Essential hypertension   UTI (lower urinary tract infection)   Urinary tract infection    Time spent: 25 minutes. Greater than 50% of this time was spent in direct contact with the patient coordinating care.    Chaya Jan  [facility] Pager [phone]  If 7PM-7AM, please contact night-coverage at [email or web address], password [email or web address] 02/23/2015, 1:48 PM  LOS: 2 days

## 2015-02-23 NOTE — Progress Notes (Signed)
PT Cancellation Note  Patient Details Name: Richard Hays MRN: [id number] DOB: 13-Aug-1925   Cancelled Treatment:    Reason Eval/Treat Not Completed: Patient's level of consciousness.  Pt could not be aroused with multiple attempts and strategies.  I spoke with his wife at length as to his home situation and feel that it would be appropriate for him to have a HHPT consult.  Because of his limited ability to walk at home he would also benefit from a w/c at home.   Myrlene Broker L 02/23/2015, 11:35 AM

## 2015-02-24 LAB — URINE CULTURE: Culture: >=100000

## 2015-02-24 MED ORDER — CEPHALEXIN 500 MG PO CAPS
500.0000 mg | ORAL_CAPSULE | Freq: Three times a day (TID) | ORAL | Status: DC
Start: 2015-02-24 — End: 2015-05-10

## 2015-02-24 NOTE — Progress Notes (Signed)
Patient discharged home with instructions given on medications,and follow up visits,family verbalized understanding. Prescriptions sent with patient. No c/o pain or discomfort noted. Accompanied by staff to an awaiting vehicle.

## 2015-02-24 NOTE — Discharge Summary (Signed)
Physician Discharge Summary  Mackay Hanauer WUJ:[id number] DOB: 17-Nov-1925 DOA: 02/21/2015  PCP: Colette Ribas, MD  Admit date: 02/21/2015 Discharge date: 02/24/2015  Time spent: 45 minutes  Recommendations for Outpatient Follow-up:  -Will be discharged home today. -To complete a course of Keflex for his Escherichia coli UTI. -Home health services will be arranged prior to discharge.   Discharge Diagnoses:  Principal Problem:   Acute encephalopathy Active Problems:   Essential hypertension   UTI (lower urinary tract infection)   Urinary tract infection   Discharge Condition: Stable and improved  Filed Weights   02/21/15 1730 02/21/15 2312  Weight: 68.04 kg (150 lb) 70.4 kg (155 lb 3.3 oz)    History of present illness:  81 year old male who  has a past medical history of Dementia; Diabetes mellitus without complication; Hypertension; and UTI (lower urinary tract infection). Today was brought to the hospital by patient's family for altered mental status. Patient has been lethargic and not eating and drinking as well. Also patient was found to have fever so he was brought to the hospital. In the ED patient was found to have UTI and started on Rocephin. Patient is a poor historian and unable to provide any history due to underlying dementia.  Hospital Course:   Acute encephalopathy -Presumed secondary to UTI and dehydration. -Resolved, at baseline per wife. -He does have baseline dementia.  Escherichia coli UTI -Discharge on Keflex as per sensitivity data for 7 days.  Dysphagia -Wife and nursing staff have noted difficulty swallowing with throat clearing and coughing as well as pocketing pills. -Suspect most of these changes are due to dementia. -Seen by speech therapy with recommendations for a dysphagia 3 diet with thin liquids.  Chronic kidney disease stage III -Creatinine is at baseline.  Diabetes mellitus -Not well controlled. -Continue outpatient  management.  Procedures:  None   Consultations:  None  Discharge Instructions  Discharge Instructions    Increase activity slowly    Complete by:  As directed             Medication List    STOP taking these medications        ciprofloxacin 500 MG tablet  Commonly known as:  CIPRO     furosemide 20 MG tablet  Commonly known as:  LASIX     nitrofurantoin 100 MG capsule  Commonly known as:  MACRODANTIN     trimethoprim 100 MG tablet  Commonly known as:  TRIMPEX      TAKE these medications        aspirin EC 325 MG tablet  Take 325 mg by mouth every morning.     atorvastatin 40 MG tablet  Commonly known as:  LIPITOR  Take 40 mg by mouth at bedtime.     cephALEXin 500 MG capsule  Commonly known as:  KEFLEX  Take 1 capsule (500 mg total) by mouth 3 (three) times daily.     digoxin 0.125 MG tablet  Commonly known as:  LANOXIN  Take 0.125 mg by mouth daily.     donepezil 10 MG tablet  Commonly known as:  ARICEPT  Take 10 mg by mouth at bedtime.     escitalopram 10 MG tablet  Commonly known as:  LEXAPRO  Take 5 mg by mouth at bedtime.     finasteride 5 MG tablet  Commonly known as:  PROSCAR  Take 5 mg by mouth daily.     glipiZIDE-metformin 2.5-500 MG per tablet  Commonly known as:  METAGLIP  Take 0.5 tablets by mouth daily.     lisinopril 5 MG tablet  Commonly known as:  PRINIVIL,ZESTRIL  Take 1 tablet by mouth daily.     megestrol 40 MG tablet  Commonly known as:  MEGACE  Take 40 mg by mouth every other day.     tamsulosin 0.4 MG Caps capsule  Commonly known as:  FLOMAX  Take 0.4 mg by mouth at bedtime.       Allergies  Allergen Reactions  . Sulfa Antibiotics Hives and Itching       Follow-up Information    Follow up with [facility].   Contact information:   [address] [place] [address] [phone]       Follow up with Colette Ribas, MD. Schedule an appointment as soon as possible for a  visit in 1 week.   Specialty:  Family Medicine   Contact information:   [address] [place] [address] [phone]        The results of significant diagnostics from this hospitalization (including imaging, microbiology, ancillary and laboratory) are listed below for reference.    Significant Diagnostic Studies: No results found.  Microbiology: Recent Results (from the past 240 hour(s))  Urine culture     Status: None   Collection Time: 02/21/15  6:55 PM  Result Value Ref Range Status   Specimen Description URINE, CATHETERIZED  Final   Special Requests NONE  Final   Culture   Final    >=100,000 COLONIES/mL ESCHERICHIA COLI Performed at [facility]    Report Status 02/24/2015 FINAL  Final   Organism ID, Bacteria ESCHERICHIA COLI  Final      Susceptibility   Escherichia coli - MIC*    AMPICILLIN 8 SENSITIVE Sensitive     CEFAZOLIN <=4 SENSITIVE Sensitive     CEFTRIAXONE <=1 SENSITIVE Sensitive     CIPROFLOXACIN >=4 RESISTANT Resistant     GENTAMICIN <=1 SENSITIVE Sensitive     IMIPENEM <=0.25 SENSITIVE Sensitive     NITROFURANTOIN <=16 SENSITIVE Sensitive     TRIMETH/SULFA >=320 RESISTANT Resistant     AMPICILLIN/SULBACTAM 4 SENSITIVE Sensitive     PIP/TAZO <=4 SENSITIVE Sensitive     * >=100,000 COLONIES/mL ESCHERICHIA COLI     Labs: Basic Metabolic Panel:  Recent Labs Lab 02/21/15 1748 02/22/15 0622 02/23/15 0609  NA 135 137 136  K 4.1 4.2 4.1  CL 103 108 109  CO2 21* 22 20*  GLUCOSE 243* 163* 173*  BUN 25* 21* 20  CREATININE 1.57* 1.25* 1.38*  CALCIUM 8.6* 8.7* 8.5*   Liver Function Tests:  Recent Labs Lab 02/21/15 1748 02/22/15 0622  AST 15 13*  ALT 11* 10*  ALKPHOS 68 62  BILITOT 0.6 1.0  PROT 7.2 6.8  ALBUMIN 3.8 3.5   No results for input(s): LIPASE, AMYLASE in the last 168 hours. No results for input(s): AMMONIA in the last 168 hours. CBC:  Recent Labs Lab 02/21/15 1748 02/22/15 0622 02/23/15 0609  WBC 10.7*  12.1* 11.6*  HGB 11.3* 12.0* 12.0*  HCT 33.5* 35.7* 36.0*  MCV 85.2 85.8 85.9  PLT 229 238 222   Cardiac Enzymes:  Recent Labs Lab 02/21/15 1748  TROPONINI <0.03   BNP: BNP (last 3 results)  Recent Labs  12/22/14 0127 02/21/15 1748  BNP 22.0 42.0    ProBNP (last 3 results) No results for input(s): PROBNP in the last 8760 hours.  CBG:  Recent Labs Lab 02/22/15 2234  02/23/15 0731 02/23/15 1128 02/23/15 1650 02/23/15 2055  GLUCAP 235* 165* 233* 180* 90       Signed:  HERNANDEZ ACOSTA,ESTELA  [facility] Pager: [phone] 02/24/2015, 4:16 PM

## 2015-02-25 DIAGNOSIS — I129 Hypertensive chronic kidney disease with stage 1 through stage 4 chronic kidney disease, or unspecified chronic kidney disease: Secondary | ICD-10-CM | POA: Diagnosis not present

## 2015-02-25 DIAGNOSIS — E119 Type 2 diabetes mellitus without complications: Secondary | ICD-10-CM | POA: Diagnosis not present

## 2015-02-25 DIAGNOSIS — N183 Chronic kidney disease, stage 3 (moderate): Secondary | ICD-10-CM | POA: Diagnosis not present

## 2015-02-25 DIAGNOSIS — F0390 Unspecified dementia without behavioral disturbance: Secondary | ICD-10-CM | POA: Diagnosis not present

## 2015-02-25 DIAGNOSIS — N390 Urinary tract infection, site not specified: Secondary | ICD-10-CM | POA: Diagnosis not present

## 2015-02-27 DIAGNOSIS — F0390 Unspecified dementia without behavioral disturbance: Secondary | ICD-10-CM | POA: Diagnosis not present

## 2015-02-27 DIAGNOSIS — I129 Hypertensive chronic kidney disease with stage 1 through stage 4 chronic kidney disease, or unspecified chronic kidney disease: Secondary | ICD-10-CM | POA: Diagnosis not present

## 2015-02-27 DIAGNOSIS — N183 Chronic kidney disease, stage 3 (moderate): Secondary | ICD-10-CM | POA: Diagnosis not present

## 2015-02-27 DIAGNOSIS — M6281 Muscle weakness (generalized): Secondary | ICD-10-CM | POA: Diagnosis not present

## 2015-02-27 DIAGNOSIS — G20 Parkinson's disease: Secondary | ICD-10-CM | POA: Diagnosis not present

## 2015-02-27 DIAGNOSIS — E119 Type 2 diabetes mellitus without complications: Secondary | ICD-10-CM | POA: Diagnosis not present

## 2015-02-27 DIAGNOSIS — N390 Urinary tract infection, site not specified: Secondary | ICD-10-CM | POA: Diagnosis not present

## 2015-02-27 DIAGNOSIS — E780 Pure hypercholesterolemia: Secondary | ICD-10-CM | POA: Diagnosis not present

## 2015-02-27 DIAGNOSIS — I638 Other cerebral infarction: Secondary | ICD-10-CM | POA: Diagnosis not present

## 2015-02-28 DIAGNOSIS — E119 Type 2 diabetes mellitus without complications: Secondary | ICD-10-CM | POA: Diagnosis not present

## 2015-02-28 DIAGNOSIS — N390 Urinary tract infection, site not specified: Secondary | ICD-10-CM | POA: Diagnosis not present

## 2015-02-28 DIAGNOSIS — F0390 Unspecified dementia without behavioral disturbance: Secondary | ICD-10-CM | POA: Diagnosis not present

## 2015-02-28 DIAGNOSIS — I129 Hypertensive chronic kidney disease with stage 1 through stage 4 chronic kidney disease, or unspecified chronic kidney disease: Secondary | ICD-10-CM | POA: Diagnosis not present

## 2015-02-28 DIAGNOSIS — N183 Chronic kidney disease, stage 3 (moderate): Secondary | ICD-10-CM | POA: Diagnosis not present

## 2015-03-01 DIAGNOSIS — N390 Urinary tract infection, site not specified: Secondary | ICD-10-CM | POA: Diagnosis not present

## 2015-03-01 DIAGNOSIS — F0390 Unspecified dementia without behavioral disturbance: Secondary | ICD-10-CM | POA: Diagnosis not present

## 2015-03-01 DIAGNOSIS — E119 Type 2 diabetes mellitus without complications: Secondary | ICD-10-CM | POA: Diagnosis not present

## 2015-03-01 DIAGNOSIS — I129 Hypertensive chronic kidney disease with stage 1 through stage 4 chronic kidney disease, or unspecified chronic kidney disease: Secondary | ICD-10-CM | POA: Diagnosis not present

## 2015-03-01 DIAGNOSIS — N183 Chronic kidney disease, stage 3 (moderate): Secondary | ICD-10-CM | POA: Diagnosis not present

## 2015-03-02 DIAGNOSIS — I129 Hypertensive chronic kidney disease with stage 1 through stage 4 chronic kidney disease, or unspecified chronic kidney disease: Secondary | ICD-10-CM | POA: Diagnosis not present

## 2015-03-02 DIAGNOSIS — N390 Urinary tract infection, site not specified: Secondary | ICD-10-CM | POA: Diagnosis not present

## 2015-03-02 DIAGNOSIS — E119 Type 2 diabetes mellitus without complications: Secondary | ICD-10-CM | POA: Diagnosis not present

## 2015-03-02 DIAGNOSIS — F0390 Unspecified dementia without behavioral disturbance: Secondary | ICD-10-CM | POA: Diagnosis not present

## 2015-03-02 DIAGNOSIS — N183 Chronic kidney disease, stage 3 (moderate): Secondary | ICD-10-CM | POA: Diagnosis not present

## 2015-03-05 DIAGNOSIS — Z1389 Encounter for screening for other disorder: Secondary | ICD-10-CM | POA: Diagnosis not present

## 2015-03-05 DIAGNOSIS — F0150 Vascular dementia without behavioral disturbance: Secondary | ICD-10-CM | POA: Diagnosis not present

## 2015-03-05 DIAGNOSIS — Z6822 Body mass index (BMI) 22.0-22.9, adult: Secondary | ICD-10-CM | POA: Diagnosis not present

## 2015-03-05 DIAGNOSIS — N390 Urinary tract infection, site not specified: Secondary | ICD-10-CM | POA: Diagnosis not present

## 2015-03-05 DIAGNOSIS — E119 Type 2 diabetes mellitus without complications: Secondary | ICD-10-CM | POA: Diagnosis not present

## 2015-03-05 DIAGNOSIS — N183 Chronic kidney disease, stage 3 (moderate): Secondary | ICD-10-CM | POA: Diagnosis not present

## 2015-03-05 DIAGNOSIS — F0390 Unspecified dementia without behavioral disturbance: Secondary | ICD-10-CM | POA: Diagnosis not present

## 2015-03-05 DIAGNOSIS — I129 Hypertensive chronic kidney disease with stage 1 through stage 4 chronic kidney disease, or unspecified chronic kidney disease: Secondary | ICD-10-CM | POA: Diagnosis not present

## 2015-03-06 DIAGNOSIS — E119 Type 2 diabetes mellitus without complications: Secondary | ICD-10-CM | POA: Diagnosis not present

## 2015-03-06 DIAGNOSIS — N183 Chronic kidney disease, stage 3 (moderate): Secondary | ICD-10-CM | POA: Diagnosis not present

## 2015-03-06 DIAGNOSIS — F0390 Unspecified dementia without behavioral disturbance: Secondary | ICD-10-CM | POA: Diagnosis not present

## 2015-03-06 DIAGNOSIS — I129 Hypertensive chronic kidney disease with stage 1 through stage 4 chronic kidney disease, or unspecified chronic kidney disease: Secondary | ICD-10-CM | POA: Diagnosis not present

## 2015-03-06 DIAGNOSIS — N390 Urinary tract infection, site not specified: Secondary | ICD-10-CM | POA: Diagnosis not present

## 2015-03-07 DIAGNOSIS — N183 Chronic kidney disease, stage 3 (moderate): Secondary | ICD-10-CM | POA: Diagnosis not present

## 2015-03-07 DIAGNOSIS — F0390 Unspecified dementia without behavioral disturbance: Secondary | ICD-10-CM | POA: Diagnosis not present

## 2015-03-07 DIAGNOSIS — N390 Urinary tract infection, site not specified: Secondary | ICD-10-CM | POA: Diagnosis not present

## 2015-03-07 DIAGNOSIS — E119 Type 2 diabetes mellitus without complications: Secondary | ICD-10-CM | POA: Diagnosis not present

## 2015-03-07 DIAGNOSIS — I129 Hypertensive chronic kidney disease with stage 1 through stage 4 chronic kidney disease, or unspecified chronic kidney disease: Secondary | ICD-10-CM | POA: Diagnosis not present

## 2015-03-08 DIAGNOSIS — I129 Hypertensive chronic kidney disease with stage 1 through stage 4 chronic kidney disease, or unspecified chronic kidney disease: Secondary | ICD-10-CM | POA: Diagnosis not present

## 2015-03-08 DIAGNOSIS — N390 Urinary tract infection, site not specified: Secondary | ICD-10-CM | POA: Diagnosis not present

## 2015-03-08 DIAGNOSIS — N183 Chronic kidney disease, stage 3 (moderate): Secondary | ICD-10-CM | POA: Diagnosis not present

## 2015-03-08 DIAGNOSIS — E119 Type 2 diabetes mellitus without complications: Secondary | ICD-10-CM | POA: Diagnosis not present

## 2015-03-08 DIAGNOSIS — F0390 Unspecified dementia without behavioral disturbance: Secondary | ICD-10-CM | POA: Diagnosis not present

## 2015-03-27 DIAGNOSIS — G20 Parkinson's disease: Secondary | ICD-10-CM | POA: Diagnosis not present

## 2015-03-27 DIAGNOSIS — I638 Other cerebral infarction: Secondary | ICD-10-CM | POA: Diagnosis not present

## 2015-03-30 DIAGNOSIS — I638 Other cerebral infarction: Secondary | ICD-10-CM | POA: Diagnosis not present

## 2015-03-30 DIAGNOSIS — G20 Parkinson's disease: Secondary | ICD-10-CM | POA: Diagnosis not present

## 2015-04-04 DIAGNOSIS — Z0001 Encounter for general adult medical examination with abnormal findings: Secondary | ICD-10-CM | POA: Diagnosis not present

## 2015-04-04 DIAGNOSIS — Z6822 Body mass index (BMI) 22.0-22.9, adult: Secondary | ICD-10-CM | POA: Diagnosis not present

## 2015-04-04 DIAGNOSIS — H6122 Impacted cerumen, left ear: Secondary | ICD-10-CM | POA: Diagnosis not present

## 2015-04-12 DIAGNOSIS — E785 Hyperlipidemia, unspecified: Secondary | ICD-10-CM | POA: Diagnosis not present

## 2015-04-12 DIAGNOSIS — N183 Chronic kidney disease, stage 3 (moderate): Secondary | ICD-10-CM | POA: Diagnosis not present

## 2015-04-12 DIAGNOSIS — I10 Essential (primary) hypertension: Secondary | ICD-10-CM | POA: Diagnosis not present

## 2015-04-12 DIAGNOSIS — E1122 Type 2 diabetes mellitus with diabetic chronic kidney disease: Secondary | ICD-10-CM | POA: Diagnosis not present

## 2015-04-24 DIAGNOSIS — J209 Acute bronchitis, unspecified: Secondary | ICD-10-CM | POA: Diagnosis not present

## 2015-04-24 DIAGNOSIS — J069 Acute upper respiratory infection, unspecified: Secondary | ICD-10-CM | POA: Diagnosis not present

## 2015-04-24 DIAGNOSIS — Z6822 Body mass index (BMI) 22.0-22.9, adult: Secondary | ICD-10-CM | POA: Diagnosis not present

## 2015-04-27 DIAGNOSIS — G20 Parkinson's disease: Secondary | ICD-10-CM | POA: Diagnosis not present

## 2015-04-27 DIAGNOSIS — I638 Other cerebral infarction: Secondary | ICD-10-CM | POA: Diagnosis not present

## 2015-04-29 DIAGNOSIS — G20 Parkinson's disease: Secondary | ICD-10-CM | POA: Diagnosis not present

## 2015-04-29 DIAGNOSIS — I638 Other cerebral infarction: Secondary | ICD-10-CM | POA: Diagnosis not present

## 2015-05-07 ENCOUNTER — Encounter (HOSPITAL_COMMUNITY): Payer: Self-pay | Admitting: Emergency Medicine

## 2015-05-07 ENCOUNTER — Inpatient Hospital Stay (HOSPITAL_COMMUNITY)
Admission: EM | Admit: 2015-05-07 | Discharge: 2015-05-10 | DRG: 682 | Disposition: A | Discharge status: Home Health | Payer: Medicare Other | Attending: Family Medicine | Admitting: Family Medicine

## 2015-05-07 ENCOUNTER — Emergency Department (HOSPITAL_COMMUNITY): Payer: Medicare Other

## 2015-05-07 DIAGNOSIS — E871 Hypo-osmolality and hyponatremia: Secondary | ICD-10-CM | POA: Diagnosis present

## 2015-05-07 DIAGNOSIS — Z7982 Long term (current) use of aspirin: Secondary | ICD-10-CM | POA: Diagnosis not present

## 2015-05-07 DIAGNOSIS — M6281 Muscle weakness (generalized): Secondary | ICD-10-CM | POA: Diagnosis not present

## 2015-05-07 DIAGNOSIS — Z66 Do not resuscitate: Secondary | ICD-10-CM | POA: Diagnosis not present

## 2015-05-07 DIAGNOSIS — I129 Hypertensive chronic kidney disease with stage 1 through stage 4 chronic kidney disease, or unspecified chronic kidney disease: Secondary | ICD-10-CM | POA: Diagnosis present

## 2015-05-07 DIAGNOSIS — Z87891 Personal history of nicotine dependence: Secondary | ICD-10-CM | POA: Diagnosis not present

## 2015-05-07 DIAGNOSIS — R404 Transient alteration of awareness: Secondary | ICD-10-CM | POA: Diagnosis not present

## 2015-05-07 DIAGNOSIS — I10 Essential (primary) hypertension: Secondary | ICD-10-CM | POA: Diagnosis not present

## 2015-05-07 DIAGNOSIS — Z7189 Other specified counseling: Secondary | ICD-10-CM | POA: Insufficient documentation

## 2015-05-07 DIAGNOSIS — E872 Acidosis, unspecified: Secondary | ICD-10-CM | POA: Diagnosis present

## 2015-05-07 DIAGNOSIS — Z515 Encounter for palliative care: Secondary | ICD-10-CM | POA: Diagnosis not present

## 2015-05-07 DIAGNOSIS — N390 Urinary tract infection, site not specified: Secondary | ICD-10-CM

## 2015-05-07 DIAGNOSIS — Z7401 Bed confinement status: Secondary | ICD-10-CM | POA: Diagnosis not present

## 2015-05-07 DIAGNOSIS — N179 Acute kidney failure, unspecified: Principal | ICD-10-CM | POA: Diagnosis present

## 2015-05-07 DIAGNOSIS — N183 Chronic kidney disease, stage 3 unspecified: Secondary | ICD-10-CM | POA: Diagnosis present

## 2015-05-07 DIAGNOSIS — Z7984 Long term (current) use of oral hypoglycemic drugs: Secondary | ICD-10-CM | POA: Diagnosis not present

## 2015-05-07 DIAGNOSIS — R630 Anorexia: Secondary | ICD-10-CM | POA: Diagnosis not present

## 2015-05-07 DIAGNOSIS — E119 Type 2 diabetes mellitus without complications: Secondary | ICD-10-CM

## 2015-05-07 DIAGNOSIS — R627 Adult failure to thrive: Secondary | ICD-10-CM | POA: Diagnosis present

## 2015-05-07 DIAGNOSIS — G9340 Encephalopathy, unspecified: Secondary | ICD-10-CM | POA: Diagnosis not present

## 2015-05-07 DIAGNOSIS — E43 Unspecified severe protein-calorie malnutrition: Secondary | ICD-10-CM | POA: Diagnosis not present

## 2015-05-07 DIAGNOSIS — F0390 Unspecified dementia without behavioral disturbance: Secondary | ICD-10-CM

## 2015-05-07 DIAGNOSIS — E860 Dehydration: Secondary | ICD-10-CM | POA: Diagnosis not present

## 2015-05-07 DIAGNOSIS — Z6822 Body mass index (BMI) 22.0-22.9, adult: Secondary | ICD-10-CM

## 2015-05-07 DIAGNOSIS — E1122 Type 2 diabetes mellitus with diabetic chronic kidney disease: Secondary | ICD-10-CM | POA: Diagnosis present

## 2015-05-07 DIAGNOSIS — R531 Weakness: Secondary | ICD-10-CM | POA: Diagnosis present

## 2015-05-07 DIAGNOSIS — N400 Enlarged prostate without lower urinary tract symptoms: Secondary | ICD-10-CM | POA: Diagnosis not present

## 2015-05-07 HISTORY — DX: Unspecified dementia, unspecified severity, without behavioral disturbance, psychotic disturbance, mood disturbance, and anxiety: F03.90

## 2015-05-07 LAB — CBC WITH DIFFERENTIAL/PLATELET
Basophils Absolute: 0.1 10*3/uL (ref 0.0–0.1)
Basophils Relative: 0 %
Eosinophils Absolute: 0.8 10*3/uL — ABNORMAL HIGH (ref 0.0–0.7)
Eosinophils Relative: 7 %
HCT: 42.3 % (ref 39.0–52.0)
HEMOGLOBIN: 14.3 g/dL (ref 13.0–17.0)
LYMPHS PCT: 18 %
Lymphs Abs: 2 10*3/uL (ref 0.7–4.0)
MCH: 28.9 pg (ref 26.0–34.0)
MCHC: 33.8 g/dL (ref 30.0–36.0)
MCV: 85.5 fL (ref 78.0–100.0)
MONO ABS: 0.8 10*3/uL (ref 0.1–1.0)
MONOS PCT: 7 %
NEUTROS PCT: 68 %
Neutro Abs: 7.6 10*3/uL (ref 1.7–7.7)
Platelets: 270 10*3/uL (ref 150–400)
RBC: 4.95 MIL/uL (ref 4.22–5.81)
RDW: 14.5 % (ref 11.5–15.5)
WBC: 11.2 10*3/uL — ABNORMAL HIGH (ref 4.0–10.5)

## 2015-05-07 LAB — COMPREHENSIVE METABOLIC PANEL
ALK PHOS: 75 U/L (ref 38–126)
ALT: 16 U/L — ABNORMAL LOW (ref 17–63)
AST: 30 U/L (ref 15–41)
Albumin: 4.2 g/dL (ref 3.5–5.0)
Anion gap: 12 (ref 5–15)
BILIRUBIN TOTAL: 1 mg/dL (ref 0.3–1.2)
BUN: 55 mg/dL — ABNORMAL HIGH (ref 6–20)
CALCIUM: 8.7 mg/dL — AB (ref 8.9–10.3)
CO2: 16 mmol/L — ABNORMAL LOW (ref 22–32)
CREATININE: 2.02 mg/dL — AB (ref 0.61–1.24)
Chloride: 104 mmol/L (ref 101–111)
GFR calc Af Amer: 32 mL/min — ABNORMAL LOW (ref >60)
GFR, EST NON AFRICAN AMERICAN: 28 mL/min — AB (ref >60)
GLUCOSE: 256 mg/dL — AB (ref 65–99)
POTASSIUM: 4.6 mmol/L (ref 3.5–5.1)
Sodium: 132 mmol/L — ABNORMAL LOW (ref 135–145)
TOTAL PROTEIN: 8 g/dL (ref 6.5–8.1)

## 2015-05-07 LAB — GLUCOSE, CAPILLARY: GLUCOSE-CAPILLARY: 201 mg/dL — AB (ref 65–99)

## 2015-05-07 LAB — URINALYSIS, ROUTINE W REFLEX MICROSCOPIC
Bilirubin Urine: NEGATIVE
GLUCOSE, UA: NEGATIVE mg/dL
Ketones, ur: NEGATIVE mg/dL
LEUKOCYTES UA: NEGATIVE
NITRITE: NEGATIVE
PH: 5.5 (ref 5.0–8.0)
Protein, ur: NEGATIVE mg/dL
Specific Gravity, Urine: 1.025 (ref 1.005–1.030)
Urobilinogen, UA: 0.2 mg/dL (ref 0.0–1.0)

## 2015-05-07 LAB — URINE MICROSCOPIC-ADD ON

## 2015-05-07 LAB — LACTIC ACID, PLASMA
LACTIC ACID, VENOUS: 3.3 mmol/L — AB (ref 0.5–2.0)
Lactic Acid, Venous: 2.7 mmol/L (ref 0.5–2.0)
Lactic Acid, Venous: 2.2 mmol/L (ref 0.5–2.0)

## 2015-05-07 LAB — TROPONIN I: Troponin I: <0.03 ng/mL (ref <0.031)

## 2015-05-07 MED ORDER — ENOXAPARIN SODIUM 30 MG/0.3ML ~~LOC~~ SOLN: 30.0000 mg | SUBCUTANEOUS | Status: DC

## 2015-05-07 MED ORDER — INFLUENZA VAC SPLIT QUAD 0.5 ML IM SUSY
0.5000 mL | PREFILLED_SYRINGE | INTRAMUSCULAR | Status: AC
  Administered 2015-05-08: 0.5 mL via INTRAMUSCULAR
  Filled 2015-05-07: qty 0.5

## 2015-05-07 MED ORDER — SODIUM CHLORIDE 0.9 % IV BOLUS (SEPSIS)
500.0000 mL | Freq: Once | INTRAVENOUS | Status: AC
  Administered 2015-05-07: 500 mL via INTRAVENOUS

## 2015-05-07 MED ORDER — FINASTERIDE 5 MG PO TABS
ORAL_TABLET | ORAL | Status: AC
  Filled 2015-05-07: qty 1

## 2015-05-07 MED ORDER — INSULIN ASPART 100 UNIT/ML ~~LOC~~ SOLN
0.0000 [IU] | Freq: Every day | SUBCUTANEOUS | Status: DC
  Administered 2015-05-07: 2 [IU] via SUBCUTANEOUS

## 2015-05-07 MED ORDER — SODIUM CHLORIDE 0.9 % IV SOLN
INTRAVENOUS | Status: AC
  Administered 2015-05-07: 21:00:00 via INTRAVENOUS

## 2015-05-07 MED ORDER — ASPIRIN EC 325 MG PO TBEC
325.0000 mg | DELAYED_RELEASE_TABLET | Freq: Every morning | ORAL | Status: DC
  Administered 2015-05-08: 325 mg via ORAL
  Filled 2015-05-07 (×3): qty 1

## 2015-05-07 MED ORDER — DONEPEZIL HCL 5 MG PO TABS
10.0000 mg | ORAL_TABLET | Freq: Every day | ORAL | Status: DC
  Administered 2015-05-07 – 2015-05-09 (×3): 10 mg via ORAL
  Filled 2015-05-07 (×4): qty 2

## 2015-05-07 MED ORDER — DEXTROSE 5 % IV SOLN
INTRAVENOUS | Status: AC
  Filled 2015-05-07: qty 10

## 2015-05-07 MED ORDER — DIGOXIN 0.0625 MG HALF TABLET
0.0625 mg | ORAL_TABLET | Freq: Every day | ORAL | Status: DC
  Administered 2015-05-08 – 2015-05-10 (×3): 0.0625 mg via ORAL
  Filled 2015-05-07 (×4): qty 1

## 2015-05-07 MED ORDER — ALPRAZOLAM 0.25 MG PO TABS
0.2500 mg | ORAL_TABLET | Freq: Every day | ORAL | Status: DC
  Administered 2015-05-07 – 2015-05-09 (×3): 0.25 mg via ORAL
  Filled 2015-05-07 (×3): qty 1

## 2015-05-07 MED ORDER — ONDANSETRON HCL 4 MG/2ML IJ SOLN: 4.0000 mg | Freq: Four times a day (QID) | INTRAMUSCULAR | Status: DC | PRN

## 2015-05-07 MED ORDER — ENOXAPARIN SODIUM 30 MG/0.3ML ~~LOC~~ SOLN
30.0000 mg | SUBCUTANEOUS | Status: DC
  Administered 2015-05-07 – 2015-05-09 (×3): 30 mg via SUBCUTANEOUS
  Filled 2015-05-07 (×3): qty 0.3

## 2015-05-07 MED ORDER — INSULIN ASPART 100 UNIT/ML ~~LOC~~ SOLN
0.0000 [IU] | Freq: Three times a day (TID) | SUBCUTANEOUS | Status: DC
  Administered 2015-05-08: 5 [IU] via SUBCUTANEOUS
  Administered 2015-05-08 (×2): 3 [IU] via SUBCUTANEOUS
  Administered 2015-05-09 (×3): 2 [IU] via SUBCUTANEOUS
  Administered 2015-05-10 (×2): 1 [IU] via SUBCUTANEOUS

## 2015-05-07 MED ORDER — ONDANSETRON HCL 4 MG PO TABS: 4.0000 mg | ORAL_TABLET | Freq: Four times a day (QID) | ORAL | Status: DC | PRN

## 2015-05-07 MED ORDER — ATORVASTATIN CALCIUM 40 MG PO TABS
40.0000 mg | ORAL_TABLET | Freq: Every day | ORAL | Status: DC
  Administered 2015-05-07 – 2015-05-09 (×3): 40 mg via ORAL
  Filled 2015-05-07 (×3): qty 1

## 2015-05-07 MED ORDER — TAMSULOSIN HCL 0.4 MG PO CAPS
0.8000 mg | ORAL_CAPSULE | Freq: Every day | ORAL | Status: DC
  Administered 2015-05-07 – 2015-05-09 (×3): 0.8 mg via ORAL
  Filled 2015-05-07 (×3): qty 2

## 2015-05-07 MED ORDER — MEGESTROL ACETATE 40 MG PO TABS
40.0000 mg | ORAL_TABLET | ORAL | Status: DC
  Administered 2015-05-08 – 2015-05-10 (×2): 40 mg via ORAL
  Filled 2015-05-07 (×2): qty 1

## 2015-05-07 MED ORDER — FINASTERIDE 5 MG PO TABS
5.0000 mg | ORAL_TABLET | Freq: Every day | ORAL | Status: DC
  Administered 2015-05-07 – 2015-05-10 (×4): 5 mg via ORAL
  Filled 2015-05-07 (×5): qty 1

## 2015-05-07 MED ORDER — DEXTROSE 5 % IV SOLN: 1.0000 g | INTRAVENOUS | Status: DC

## 2015-05-07 MED ORDER — DEXTROSE 5 % IV SOLN
1.0000 g | INTRAVENOUS | Status: DC
  Administered 2015-05-07 – 2015-05-08 (×2): 1 g via INTRAVENOUS
  Filled 2015-05-07 (×3): qty 10

## 2015-05-07 MED ORDER — ESCITALOPRAM OXALATE 10 MG PO TABS
5.0000 mg | ORAL_TABLET | Freq: Every day | ORAL | Status: DC
  Administered 2015-05-07 – 2015-05-09 (×3): 5 mg via ORAL
  Filled 2015-05-07: qty 0.5
  Filled 2015-05-07 (×2): qty 1
  Filled 2015-05-07: qty 0.5
  Filled 2015-05-07 (×2): qty 1

## 2015-05-07 MED ORDER — SODIUM CHLORIDE 0.9 % IV SOLN
INTRAVENOUS | Status: DC
  Administered 2015-05-07 – 2015-05-08 (×2): via INTRAVENOUS
  Administered 2015-05-08: 1 mL via INTRAVENOUS
  Administered 2015-05-09 – 2015-05-10 (×2): via INTRAVENOUS

## 2015-05-07 NOTE — ED Provider Notes (Signed)
CSN: [id number]     Arrival date & time 05/07/15  1516 History   First MD Initiated Contact with Patient 05/07/15 1617     Chief Complaint  Patient presents with  . Weakness      Patient is a 81 y.o. male presenting with weakness. The history is provided by a caregiver and a relative. The history is limited by the condition of the patient (Hx dementia).  Weakness  Pt was seen at 1620. Per pt's family: Pt with gradual generalized weakness, decreased PO intake, and "foul smelling urine" for the past 2 to 3 days. Pt's family states pt has been "quieter then usual." Pt's family states pt also has been "cheeking his food" for the past 1 to 2 weeks. Denies falls, no focal motor weakness, no vomiting/diarrhea, no fevers, no cough/SOB. Pt has significant hx of dementia.     Past Medical History  Diagnosis Date  . Dementia   . Diabetes mellitus without complication (HCC)   . Hypertension   . UTI (lower urinary tract infection)    History reviewed. No pertinent past surgical history.  Social History  Substance Use Topics  . Smoking status: Former Games developer  . Smokeless tobacco: None  . Alcohol Use: No    Review of Systems  Unable to perform ROS: Dementia  Neurological: Positive for weakness.      Allergies  Sulfa antibiotics  Home Medications   Prior to Admission medications   Medication Sig Start Date End Date Taking? Authorizing Provider  acetaminophen-codeine (TYLENOL #3) 300-30 MG tablet Take 1 tablet by mouth daily as needed for moderate pain.  03/12/15  Yes Historical Provider, MD  ALPRAZolam Prudy Feeler) 0.25 MG tablet Take 0.25 mg by mouth at bedtime.   Yes Historical Provider, MD  aspirin EC 325 MG tablet Take 325 mg by mouth every morning.   Yes Historical Provider, MD  atorvastatin (LIPITOR) 40 MG tablet Take 40 mg by mouth at bedtime. 02/18/14  Yes Historical Provider, MD  digoxin (LANOXIN) 0.125 MG tablet Take 0.0625 mg by mouth daily.    Yes Historical Provider, MD   donepezil (ARICEPT) 10 MG tablet Take 10 mg by mouth at bedtime. 02/18/14  Yes Historical Provider, MD  escitalopram (LEXAPRO) 10 MG tablet Take 5 mg by mouth at bedtime.  12/19/14  Yes Historical Provider, MD  finasteride (PROSCAR) 5 MG tablet Take 5 mg by mouth daily.   Yes Historical Provider, MD  furosemide (LASIX) 20 MG tablet Take 20 mg by mouth daily as needed for fluid.   Yes Historical Provider, MD  glipiZIDE-metformin (METAGLIP) 2.5-500 MG per tablet Take 0.5 tablets by mouth daily.    Yes Historical Provider, MD  lisinopril (PRINIVIL,ZESTRIL) 5 MG tablet Take 1 tablet by mouth daily. 10/23/14  Yes Historical Provider, MD  megestrol (MEGACE) 40 MG tablet Take 40 mg by mouth every other day.    Yes Historical Provider, MD  tamsulosin (FLOMAX) 0.4 MG CAPS capsule Take 0.8 mg by mouth at bedtime.    Yes Historical Provider, MD  cephALEXin (KEFLEX) 500 MG capsule Take 1 capsule (500 mg total) by mouth 3 (three) times daily. Patient not taking: Reported on 05/07/2015 02/24/15   Henderson Cloud, MD  ibuprofen (ADVIL,MOTRIN) 800 MG tablet Take 1 tablet by mouth daily as needed. 03/12/15   Historical Provider, MD   BP 112/80 mmHg  Pulse 100  Temp(Src) 97.4 F (36.3 C) (Oral)  Resp 20  SpO2 97% Physical Exam  1625: Physical examination:  Nursing notes reviewed; Vital signs and O2 SAT reviewed;  Constitutional: Thin, frail. In no acute distress; Head:  Normocephalic, atraumatic; Eyes: EOMI, PERRL, No scleral icterus; ENMT: Mouth and pharynx normal, Mucous membranes dry; Neck: Supple, Full range of motion, No lymphadenopathy; Cardiovascular: Regular rate and rhythm, No gallop; Respiratory: Breath sounds clear & equal bilaterally, No wheezes. Normal respiratory effort/excursion; Chest: Nontender, Movement normal; Abdomen: Soft, Nontender, Nondistended, Normal bowel sounds; Genitourinary: No CVA tenderness; Extremities: Pulses normal, No tenderness, No edema, No calf edema or asymmetry.;  Neuro: Awake, alert, eyes open spontaneously. Minimal speech per hx. No facial droop. Moves all extremities spontaneously.; Skin: Color normal, Warm, Dry.    ED Course  Procedures (including critical care time) Labs Review   Imaging Review  I have personally reviewed and evaluated these images and lab results as part of my medical decision-making.   EKG Interpretation None      MDM  MDM Reviewed: previous chart, nursing note and vitals Reviewed previous: labs and ECG Interpretation: labs, ECG, x-ray and CT scan      Results for orders placed or performed during the hospital encounter of 05/07/15  Urinalysis, Routine w reflex microscopic (not at [facility])  Result Value Ref Range   Color, Urine YELLOW YELLOW   APPearance CLEAR CLEAR   Specific Gravity, Urine 1.025 1.005 - 1.030   pH 5.5 5.0 - 8.0   Glucose, UA NEGATIVE NEGATIVE mg/dL   Hgb urine dipstick LARGE (A) NEGATIVE   Bilirubin Urine NEGATIVE NEGATIVE   Ketones, ur NEGATIVE NEGATIVE mg/dL   Protein, ur NEGATIVE NEGATIVE mg/dL   Urobilinogen, UA 0.2 0.0 - 1.0 mg/dL   Nitrite NEGATIVE NEGATIVE   Leukocytes, UA NEGATIVE NEGATIVE  CBC with Differential  Result Value Ref Range   WBC 11.2 (H) 4.0 - 10.5 K/uL   RBC 4.95 4.22 - 5.81 MIL/uL   Hemoglobin 14.3 13.0 - 17.0 g/dL   HCT 16.1 09.6 - 04.5 %   MCV 85.5 78.0 - 100.0 fL   MCH 28.9 26.0 - 34.0 pg   MCHC 33.8 30.0 - 36.0 g/dL   RDW 40.9 81.1 - 91.4 %   Platelets 270 150 - 400 K/uL   Neutrophils Relative % 68 %   Neutro Abs 7.6 1.7 - 7.7 K/uL   Lymphocytes Relative 18 %   Lymphs Abs 2.0 0.7 - 4.0 K/uL   Monocytes Relative 7 %   Monocytes Absolute 0.8 0.1 - 1.0 K/uL   Eosinophils Relative 7 %   Eosinophils Absolute 0.8 (H) 0.0 - 0.7 K/uL   Basophils Relative 0 %   Basophils Absolute 0.1 0.0 - 0.1 K/uL  Comprehensive metabolic panel  Result Value Ref Range   Sodium 132 (L) 135 - 145 mmol/L   Potassium 4.6 3.5 - 5.1 mmol/L   Chloride 104 101 - 111 mmol/L    CO2 16 (L) 22 - 32 mmol/L   Glucose, Bld 256 (H) 65 - 99 mg/dL   BUN 55 (H) 6 - 20 mg/dL   Creatinine, Ser 7.82 (H) 0.61 - 1.24 mg/dL   Calcium 8.7 (L) 8.9 - 10.3 mg/dL   Total Protein 8.0 6.5 - 8.1 g/dL   Albumin 4.2 3.5 - 5.0 g/dL   AST 30 15 - 41 U/L   ALT 16 (L) 17 - 63 U/L   Alkaline Phosphatase 75 38 - 126 U/L   Total Bilirubin 1.0 0.3 - 1.2 mg/dL   GFR calc non Af Amer 28 (L) >60 mL/min   GFR calc  Af Amer 32 (L) >60 mL/min   Anion gap 12 5 - 15  Troponin I  Result Value Ref Range   Troponin I <0.03 <0.031 ng/mL  Urine microscopic-add on  Result Value Ref Range   Squamous Epithelial / LPF FEW (A) RARE   WBC, UA 0-2 <3 WBC/hpf   RBC / HPF 21-50 <3 RBC/hpf   Bacteria, UA FEW (A) RARE   Casts GRANULAR CAST (A) NEGATIVE   Ct Head Wo Contrast 05/07/2015   CLINICAL DATA:  81 year old male with generalized weakness, loss of appetite, foul smelling urine for 3 days. Initial encounter.  EXAM: CT HEAD WITHOUT CONTRAST  TECHNIQUE: Contiguous axial images were obtained from the base of the skull through the vertex without intravenous contrast.  COMPARISON:  12/23/2014 and earlier.  FINDINGS: Visualized paranasal sinuses and mastoids remain clear. No acute osseous abnormality identified. Visualized orbits and scalp soft tissues are within normal limits.  Calcified atherosclerosis at the skull base. Cerebral volume is stable. No ventriculomegaly. No midline shift, mass effect, or evidence of intracranial mass lesion. No suspicious intracranial vascular hyperdensity. Gray-white matter differentiation is stable throughout the brain. No cortically based acute infarct identified. No acute intracranial hemorrhage identified.  IMPRESSION: Stable non contrast CT appearance of the brain, largely unremarkable for age.   Electronically Signed   By: Odessa Fleming M.D.   On: 05/07/2015 18:12   Dg Chest Port 1 View 05/07/2015   CLINICAL DATA:  Generalized weakness and decreased appetite  EXAM: PORTABLE CHEST - 1  VIEW  COMPARISON:  01/19/2015  FINDINGS: Cardiac shadow is within normal limits. The lungs are clear bilaterally. No acute bony abnormality is seen.  IMPRESSION: No acute abnormality noted.   Electronically Signed   By: Alcide Clever M.D.   On: 05/07/2015 16:57    1830:  BUN/Cr elevated from baseline and pt appears clinically dehydrated; judicious IVF given.   Dx and testing d/w pt and family.  Questions answered.  Verb understanding, agreeable to admit.  T/C to [facility] Dr. Karilyn Cota, case discussed, including:  HPI, pertinent PM/SHx, VS/PE, dx testing, ED course and treatment:  Agreeable to admit, requests to write temporary orders, obtain medical bed to team APAdmits.   Samuel Jester, DO 05/09/15 2001

## 2015-05-07 NOTE — ED Notes (Signed)
PT wife reports generalized weakness, with decreased appetitive and foul smelling urine x3 days and denies any fevers. Wife reports pt has dementia.

## 2015-05-07 NOTE — Progress Notes (Signed)
Lactic Acid 3.3. Notified Dr.Kirby. No new orders at this time

## 2015-05-07 NOTE — H&P (Signed)
[facility] History and Physical  Richard Hays ZOX:[id number] DOB: 1926/04/30 DOA: 05/07/2015  Referring physician: ER PCP: Colette Ribas, MD   Chief Complaint: Weakness  HPI: Richard Hays is a 81 y.o. male  This is an 81 year old man who was diagnosed with dementia probably 5 years ago. He has been deteriorating since November 2015. His usual state is that he is unable to walk, requires help with all activities of daily living including dressing, bathing, feeding. He has become largely nonverbal now, especially in the last couple of months. He also has had difficulty swallowing his food and his wife notices that his food tends to stay in his mouth. He now presents with 2-3 day history of decreased by mouth intake and foul-smelling urine as well as more lethargy. He is now being admitted for further investigation and management.   Review of Systems:  Unable to obtain review of systems secondary to patient's dementia.  Past Medical History  Diagnosis Date  . Dementia   . Diabetes mellitus without complication (HCC)   . Hypertension   . UTI (lower urinary tract infection)   . Dementia 05/07/2015   History reviewed. No pertinent past surgical history. Social History:  reports that he has quit smoking. He does not have any smokeless tobacco history on file. He reports that he does not drink alcohol or use illicit drugs.  Allergies  Allergen Reactions  . Sulfa Antibiotics Hives and Itching     Family history: The wife tells me that his brother also had dementia.   Prior to Admission medications   Medication Sig Start Date End Date Taking? Authorizing Provider  acetaminophen-codeine (TYLENOL #3) 300-30 MG tablet Take 1 tablet by mouth daily as needed for moderate pain.  03/12/15  Yes Historical Provider, MD  ALPRAZolam Prudy Feeler) 0.25 MG tablet Take 0.25 mg by mouth at bedtime.   Yes Historical Provider, MD  aspirin EC 325 MG tablet Take 325 mg by mouth every morning.    Yes Historical Provider, MD  atorvastatin (LIPITOR) 40 MG tablet Take 40 mg by mouth at bedtime. 02/18/14  Yes Historical Provider, MD  digoxin (LANOXIN) 0.125 MG tablet Take 0.0625 mg by mouth daily.    Yes Historical Provider, MD  donepezil (ARICEPT) 10 MG tablet Take 10 mg by mouth at bedtime. 02/18/14  Yes Historical Provider, MD  escitalopram (LEXAPRO) 10 MG tablet Take 5 mg by mouth at bedtime.  12/19/14  Yes Historical Provider, MD  finasteride (PROSCAR) 5 MG tablet Take 5 mg by mouth daily.   Yes Historical Provider, MD  furosemide (LASIX) 20 MG tablet Take 20 mg by mouth daily as needed for fluid.   Yes Historical Provider, MD  glipiZIDE-metformin (METAGLIP) 2.5-500 MG per tablet Take 0.5 tablets by mouth daily.    Yes Historical Provider, MD  lisinopril (PRINIVIL,ZESTRIL) 5 MG tablet Take 1 tablet by mouth daily. 10/23/14  Yes Historical Provider, MD  megestrol (MEGACE) 40 MG tablet Take 40 mg by mouth every other day.    Yes Historical Provider, MD  tamsulosin (FLOMAX) 0.4 MG CAPS capsule Take 0.8 mg by mouth at bedtime.    Yes Historical Provider, MD  cephALEXin (KEFLEX) 500 MG capsule Take 1 capsule (500 mg total) by mouth 3 (three) times daily. Patient not taking: Reported on 05/07/2015 02/24/15   Henderson Cloud, MD  ibuprofen (ADVIL,MOTRIN) 800 MG tablet Take 1 tablet by mouth daily as needed. 03/12/15   Historical Provider, MD   Physical Exam: Filed Vitals:  05/07/15 1752 05/07/15 1800 05/07/15 1830 05/07/15 1900  BP:  113/85 125/91 120/76  Pulse:      Temp: 97.4 F (36.3 C)     TempSrc: Oral     Resp:  18 16 12   SpO2:        Wt Readings from Last 3 Encounters:  02/21/15 70.4 kg (155 lb 3.3 oz)  01/19/15 68.04 kg (150 lb)  12/23/14 68.04 kg (150 lb)    General:  Appears clinically dehydrated. Opens his eyes to his name. No further communication. Eyes: PERRL, normal lids, irises & conjunctiva ENT: grossly normal hearing, lips & tongue Neck: no LAD, masses or  thyromegaly Cardiovascular: RRR, no m/r/g. No LE edema. Telemetry: SR, no arrhythmias  Respiratory: CTA bilaterally, no w/r/r. Normal respiratory effort. Abdomen: soft, ntnd Skin: no rash or induration seen on limited exam Musculoskeletal: grossly normal tone BUE/BLE Psychiatric: Not examined. Neurologic: grossly non-focal.          Labs on Admission:  Basic Metabolic Panel:  Recent Labs Lab 05/07/15 1605  NA 132*  K 4.6  CL 104  CO2 16*  GLUCOSE 256*  BUN 55*  CREATININE 2.02*  CALCIUM 8.7*   Liver Function Tests:  Recent Labs Lab 05/07/15 1605  AST 30  ALT 16*  ALKPHOS 75  BILITOT 1.0  PROT 8.0  ALBUMIN 4.2   No results for input(s): LIPASE, AMYLASE in the last 168 hours. No results for input(s): AMMONIA in the last 168 hours. CBC:  Recent Labs Lab 05/07/15 1605  WBC 11.2*  NEUTROABS 7.6  HGB 14.3  HCT 42.3  MCV 85.5  PLT 270   Cardiac Enzymes:  Recent Labs Lab 05/07/15 1605  TROPONINI <0.03    BNP (last 3 results)  Recent Labs  12/22/14 0127 02/21/15 1748  BNP 22.0 42.0    ProBNP (last 3 results) No results for input(s): PROBNP in the last 8760 hours.  CBG: No results for input(s): GLUCAP in the last 168 hours.  Radiological Exams on Admission: Ct Head Wo Contrast  05/07/2015   CLINICAL DATA:  81 year old male with generalized weakness, loss of appetite, foul smelling urine for 3 days. Initial encounter.  EXAM: CT HEAD WITHOUT CONTRAST  TECHNIQUE: Contiguous axial images were obtained from the base of the skull through the vertex without intravenous contrast.  COMPARISON:  12/23/2014 and earlier.  FINDINGS: Visualized paranasal sinuses and mastoids remain clear. No acute osseous abnormality identified. Visualized orbits and scalp soft tissues are within normal limits.  Calcified atherosclerosis at the skull base. Cerebral volume is stable. No ventriculomegaly. No midline shift, mass effect, or evidence of intracranial mass lesion. No  suspicious intracranial vascular hyperdensity. Gray-white matter differentiation is stable throughout the brain. No cortically based acute infarct identified. No acute intracranial hemorrhage identified.  IMPRESSION: Stable non contrast CT appearance of the brain, largely unremarkable for age.   Electronically Signed   By: Odessa Fleming M.D.   On: 05/07/2015 18:12   Dg Chest Port 1 View  05/07/2015   CLINICAL DATA:  Generalized weakness and decreased appetite  EXAM: PORTABLE CHEST - 1 VIEW  COMPARISON:  01/19/2015  FINDINGS: Cardiac shadow is within normal limits. The lungs are clear bilaterally. No acute bony abnormality is seen.  IMPRESSION: No acute abnormality noted.   Electronically Signed   By: Alcide Clever M.D.   On: 05/07/2015 16:57     Assessment/Plan   1. Dehydration. He will be given IV fluids. 2. Possible UTI. Urinalysis so far is  negative. I will treat him empirically with intravenous Rocephin. 3. Diabetes. Hold metformin. Sliding scale of insulin. 4. Hypertension. Hold ACE inhibitor. Monitor blood pressure closely. 5. End-stage dementia. His palliative performance score is 30% or less. Prognosis appears to be poor.  He will be admitted to the floor. Further recommendations will depend on patient's hospital progress.   Code Status: Full code for the time being. I have discussed CODE STATUS with the patient's wife and daughter and they will think about this in more detail.   DVT Prophylaxis: Lovenox.  Family Communication: I discussed the plan with the patient's family.   Disposition Plan: Depending on progress. He may be a candidate for skilled nursing facility.   Time spent: 60 minutes  Rose-Marie Hickling C [facility] Pager [phone].

## 2015-05-07 NOTE — Progress Notes (Signed)
Lactic Acid 2.2. Dr.Kirby made aware. There are no new orders at this time. Will continue to monitor

## 2015-05-07 NOTE — ED Notes (Signed)
CRITICAL VALUE ALERT  Critical value received:  Lactic acid 2.7  Date of notification:  05/07/15  Time of notification:  1925  Critical value read back:Yes.    Nurse who received alert:  Bronson Curb, RN  MD notified (1st page):  Dr Karilyn Cota  Time of first page:  2130  MD notified (2nd page):  Time of second page:  Responding MD:  Dr Karilyn Cota  Time MD responded:  2130

## 2015-05-08 DIAGNOSIS — E1122 Type 2 diabetes mellitus with diabetic chronic kidney disease: Secondary | ICD-10-CM | POA: Diagnosis not present

## 2015-05-08 DIAGNOSIS — N183 Chronic kidney disease, stage 3 unspecified: Secondary | ICD-10-CM | POA: Diagnosis present

## 2015-05-08 DIAGNOSIS — N179 Acute kidney failure, unspecified: Secondary | ICD-10-CM | POA: Diagnosis not present

## 2015-05-08 DIAGNOSIS — E43 Unspecified severe protein-calorie malnutrition: Secondary | ICD-10-CM | POA: Diagnosis not present

## 2015-05-08 DIAGNOSIS — N400 Enlarged prostate without lower urinary tract symptoms: Secondary | ICD-10-CM | POA: Diagnosis not present

## 2015-05-08 DIAGNOSIS — Z66 Do not resuscitate: Secondary | ICD-10-CM | POA: Diagnosis not present

## 2015-05-08 DIAGNOSIS — Z87891 Personal history of nicotine dependence: Secondary | ICD-10-CM | POA: Diagnosis not present

## 2015-05-08 DIAGNOSIS — E872 Acidosis, unspecified: Secondary | ICD-10-CM | POA: Diagnosis present

## 2015-05-08 DIAGNOSIS — Z7984 Long term (current) use of oral hypoglycemic drugs: Secondary | ICD-10-CM | POA: Diagnosis not present

## 2015-05-08 DIAGNOSIS — N390 Urinary tract infection, site not specified: Secondary | ICD-10-CM | POA: Diagnosis not present

## 2015-05-08 DIAGNOSIS — E860 Dehydration: Secondary | ICD-10-CM | POA: Diagnosis not present

## 2015-05-08 DIAGNOSIS — F0390 Unspecified dementia without behavioral disturbance: Secondary | ICD-10-CM | POA: Diagnosis not present

## 2015-05-08 DIAGNOSIS — I129 Hypertensive chronic kidney disease with stage 1 through stage 4 chronic kidney disease, or unspecified chronic kidney disease: Secondary | ICD-10-CM | POA: Diagnosis not present

## 2015-05-08 DIAGNOSIS — E871 Hypo-osmolality and hyponatremia: Secondary | ICD-10-CM | POA: Diagnosis not present

## 2015-05-08 DIAGNOSIS — Z6822 Body mass index (BMI) 22.0-22.9, adult: Secondary | ICD-10-CM | POA: Diagnosis not present

## 2015-05-08 DIAGNOSIS — R627 Adult failure to thrive: Secondary | ICD-10-CM | POA: Diagnosis not present

## 2015-05-08 DIAGNOSIS — G9340 Encephalopathy, unspecified: Secondary | ICD-10-CM | POA: Diagnosis not present

## 2015-05-08 DIAGNOSIS — Z7982 Long term (current) use of aspirin: Secondary | ICD-10-CM | POA: Diagnosis not present

## 2015-05-08 LAB — URINE CULTURE: Culture: 4000

## 2015-05-08 LAB — COMPREHENSIVE METABOLIC PANEL
ALK PHOS: 64 U/L (ref 38–126)
ALT: 14 U/L — AB (ref 17–63)
ANION GAP: 9 (ref 5–15)
AST: 25 U/L (ref 15–41)
Albumin: 3.5 g/dL (ref 3.5–5.0)
BILIRUBIN TOTAL: 0.7 mg/dL (ref 0.3–1.2)
BUN: 51 mg/dL — ABNORMAL HIGH (ref 6–20)
CALCIUM: 8 mg/dL — AB (ref 8.9–10.3)
CO2: 18 mmol/L — ABNORMAL LOW (ref 22–32)
CREATININE: 1.68 mg/dL — AB (ref 0.61–1.24)
Chloride: 107 mmol/L (ref 101–111)
GFR, EST AFRICAN AMERICAN: 41 mL/min — AB (ref >60)
GFR, EST NON AFRICAN AMERICAN: 35 mL/min — AB (ref >60)
Glucose, Bld: 221 mg/dL — ABNORMAL HIGH (ref 65–99)
Potassium: 4.4 mmol/L (ref 3.5–5.1)
SODIUM: 134 mmol/L — AB (ref 135–145)
TOTAL PROTEIN: 6.9 g/dL (ref 6.5–8.1)

## 2015-05-08 LAB — GLUCOSE, CAPILLARY
GLUCOSE-CAPILLARY: 216 mg/dL — AB (ref 65–99)
GLUCOSE-CAPILLARY: 264 mg/dL — AB (ref 65–99)
Glucose-Capillary: 219 mg/dL — ABNORMAL HIGH (ref 65–99)
Glucose-Capillary: 195 mg/dL — ABNORMAL HIGH (ref 65–99)

## 2015-05-08 LAB — CBC
HCT: 38.7 % — ABNORMAL LOW (ref 39.0–52.0)
HEMOGLOBIN: 12.9 g/dL — AB (ref 13.0–17.0)
MCH: 28.6 pg (ref 26.0–34.0)
MCHC: 33.3 g/dL (ref 30.0–36.0)
MCV: 85.8 fL (ref 78.0–100.0)
PLATELETS: 229 10*3/uL (ref 150–400)
RBC: 4.51 MIL/uL (ref 4.22–5.81)
RDW: 14.5 % (ref 11.5–15.5)
WBC: 9.2 10*3/uL (ref 4.0–10.5)

## 2015-05-08 LAB — LACTIC ACID, PLASMA: Lactic Acid, Venous: 1.9 mmol/L (ref 0.5–2.0)

## 2015-05-08 NOTE — Progress Notes (Signed)
Patient non-verbal.  Wife in room, answered questions related to flu vaccine, stated patient receives yearly and denied any reactions in past.

## 2015-05-08 NOTE — Care Management Note (Addendum)
Case Management Note  Patient Details  Name: Malikye Reppond MRN: [id number] Date of Birth: 1926/07/19   Expected Discharge Date:    05/08/2015              Expected Discharge Plan:  Home w Home Health Services  In-House Referral:  Clinical Social Work, Hospice / Palliative Care  Discharge planning Services  CM Consult  Post Acute Care Choice:  Home Health Choice offered to:  Spouse  DME Arranged:    DME Agency:     HH Arranged:  RN, PT, Nurse's Aide HH Agency:  [facility]  Status of Service:  In process, will continue to follow  Medicare Important Message Given:    Date Medicare IM Given:    Medicare IM give by:    Date Additional Medicare IM Given:    Additional Medicare Important Message give by:     If discussed at Long Length of Stay Meetings, dates discussed:    Additional Comments: Pt is from home, lives with wife who is his primary caregiver. Pt has dementia and is dehydrated. Pt has hospital bed, wheelchair, walker and BSC at home. Pt's wife says he follows commands and converses at baseline. Pt's wife states he stays in the bed most of the time. He will not get out of the bed for her but will if a man is present (he thinks he will hurt her). He is able to transfer and ambulate short distances with assistance. Pt's daughter and CSW present during assessment. Pt's wife receives some assistance from family and friends but says she feels capable of cont to care for him as she has been. Encouraged pt's wife and daughter to plan for the future and have a plan for when the wife needs more assistance with care or becomes tired herself. Pt's daughter acknowledges that may be an issue in the future. Pt's daughter brought up Hospice and says she does not think her mom is ready for that right now but the daughter is open to any resources that are available.  Pt's daughter would appreciate Palliative consult to assist her and her mother in long term GOC planning/resources.  Pt's son will be moving back to [place] in 1 months to assist wife with caring for pt.  Pt's daughter provided with list of PD agencies and told family is responsible for calling agencies and making arrangements. Pt's daughter says she plans to assist financially because that is "how she can help". Pt has used [facility] in the past for [facility] services. PT has recommended no f/u. Daughter and wife anticipate pt will return home at DC with [facility] RN, aid, and PT.  Will cont to follow for DC planning.     Malcolm Metro, RN 05/08/2015, 1:13 PM

## 2015-05-08 NOTE — Progress Notes (Signed)
Daughter in room.  Patient asked clearly for drink of water, smiles occasionally, recognized daughter.

## 2015-05-08 NOTE — Evaluation (Signed)
Physical Therapy Evaluation Patient Details Name: Richard Hays MRN: [id number] DOB: 07-17-1926 Today's Date: 05/08/2015   History of Present Illness  This is an 81 year old man who was diagnosed with dementia probably 5 years ago. He has been deteriorating since November 2015. His usual state is that he is unable to walk, requires help with all activities of daily living including dressing, bathing, feeding. He has become largely nonverbal now, especially in the last couple of months. He also has had difficulty swallowing his food and his wife notices that his food tends to stay in his mouth. He now presents with 2-3 day history of decreased by mouth intake and foul-smelling urine as well as more lethargy. He is now being admitted for further investigation and management.  Clinical Impression   Pt was seen for evaluation.  He was drowsy but would awaken.  He was disoriented to place, time and situation.  He was not able to follow any directions.  He was able to move all extremities per wife's report.  Passively, he has full ROM.  Wife states that she exercises his legs daily as she notices that he pulls both legs up into full flexion.  I explained to her that this is probably a result of advanced dementia where people pull into the fetal position.  I commended her for helping to keep the ROM in his legs.  His wife is trying to care for pt by herself.  She has a daughter for whom she relies on advice.  If she can no longer care for pt at home, he would be appropriate for long term nursing home care.    Follow Up Recommendations No PT follow up    Equipment Recommendations  None recommended by PT    Recommendations for Other Services   none    Precautions / Restrictions Precautions Precautions: Fall Restrictions Weight Bearing Restrictions: No      Mobility  Bed Mobility Overal bed mobility: +2 for physical assistance             General bed mobility comments: pt is unable to  assist in any mobility  Transfers                 General transfer comment: pt will need a lift mechanism for safe transfers  Ambulation/Gait             General Gait Details: non ambulatory  Stairs            Wheelchair Mobility    Modified Rankin (Stroke Patients Only)       Balance                                             Pertinent Vitals/Pain Pain Assessment: Faces Pain Score: 0-No pain    Home Living Family/patient expects to be discharged to:: Unsure Living Arrangements: Spouse/significant other                    Prior Function Level of Independence: Needs assistance   Gait / Transfers Assistance Needed: pt has become bedbound and needs total care  ADL's / Homemaking Assistance Needed: total care        Hand Dominance        Extremity/Trunk Assessment               Lower Extremity Assessment: Difficult to assess due  to impaired cognition (pt is able to move extremities independently but will not follow any commands.Marland KitchenMarland KitchenPassive ROM is WNL)         Communication   Communication: Other (comment) (minimaly verbal)  Cognition Arousal/Alertness: Lethargic Behavior During Therapy: Flat affect Overall Cognitive Status: History of cognitive impairments - at baseline                      General Comments      Exercises        Assessment/Plan    PT Assessment Patent does not need any further PT services  PT Diagnosis     PT Problem List    PT Treatment Interventions     PT Goals (Current goals can be found in the Care Plan section) Acute Rehab PT Goals PT Goal Formulation: All assessment and education complete, DC therapy    Frequency     Barriers to discharge        Co-evaluation               End of Session   Activity Tolerance: Patient tolerated treatment well Patient left: in bed;with call bell/phone within reach;with bed alarm set;with family/visitor present       Functional Assessment Tool Used: clinical judgement Functional Limitation: Mobility: Walking and moving around Mobility: Walking and Moving Around Current Status (Z6109): 100 percent impaired, limited or restricted Mobility: Walking and Moving Around Goal Status (U0454): 100 percent impaired, limited or restricted Mobility: Walking and Moving Around Discharge Status [phone]): 100 percent impaired, limited or restricted    Time: 0835-0902 PT Time Calculation (min) (ACUTE ONLY): 27 min   Charges:   PT Evaluation $Initial PT Evaluation Tier I: 1 Procedure     PT G Codes:   PT G-Codes **NOT FOR INPATIENT CLASS** Functional Assessment Tool Used: clinical judgement Functional Limitation: Mobility: Walking and moving around Mobility: Walking and Moving Around Current Status (B1478): 100 percent impaired, limited or restricted Mobility: Walking and Moving Around Goal Status (G9562): 100 percent impaired, limited or restricted Mobility: Walking and Moving Around Discharge Status (Z3086): 100 percent impaired, limited or restricted    Konrad Penta  PT 05/08/2015, 9:08 AM [phone]

## 2015-05-08 NOTE — Progress Notes (Signed)
Brief initial visit offering support for patient, his wife and daughter.  Will follow up.

## 2015-05-08 NOTE — Clinical Social Work Note (Signed)
Clinical Social Work Assessment  Patient Details  Name: Richard Hays MRN: [id number] Date of Birth: [birth year]  Date of referral:  05/08/15               Reason for consult:  Facility Placement                Permission sought to share information with:    Permission granted to share information::     Name::        Agency::     Relationship::     Contact Information:     Housing/Transportation Living arrangements for the past 2 months:  Single Family Home Source of Information:  Spouse, Adult Children Patient Interpreter Needed:  None Criminal Activity/Legal Involvement Pertinent to Current Situation/Hospitalization:  No - Comment as needed Significant Relationships:  Spouse, Adult Children Lives with:  Spouse Do you feel safe going back to the place where you live?  Yes Need for family participation in patient care:  Yes (Comment)  Care giving concerns:  Patient's wife his been his care giver. She has done well overall.    Social Worker assessment / plan:  Patient's wife and Daughter, Stanton Kidney were at bedside.  It was reported that patient lives with his wife and that she is his primary caretaker.  She indicated that she does have some assistance from a friend.  She advised that patient is mainly in the bed but he does have the ability to ambulate with a walker and assistance from a male.  CSW discussed SNF options based on PT recommendation and insurance.  Stanton Kidney indicated that the family could not afford to private pay. They also advised that patient may need to be as he performs better for males as he is afraid that he will hurt a male assisting him.  Case manager discussed private duty options as well as home health options.  CSW provided patient's family with a SNF list for their review.  Patient's wife stated that she accepts that patient won't get better. Stanton Kidney became emotional and advised that she was having a difficulty time with the reality of patient's decline.  CSW provided  supportive counseling and encouragement.   Employment status:  Retired Database administrator PT Recommendations:  No Follow Up Information / Referral to community resources:  Skilled Nursing Facility  Patient/Family's Response to care: Family's agreeable to any assistance that can be provided.   Patient/Family's Understanding of and Emotional Response to Diagnosis, Current Treatment, and Prognosis:  Family understands patient's diagnosis, treatment and prognosis.    Emotional Assessment Appearance:  Appears stated age Attitude/Demeanor/Rapport:  Unable to Assess Affect (typically observed):  Unable to Assess Orientation:    Alcohol / Substance use:  Not Applicable Psych involvement (Current and /or in the community):  No (Comment)  Discharge Needs  Concerns to be addressed:  Discharge Planning Concerns Readmission within the last 30 days:  No Current discharge risk:  None Barriers to Discharge:  No Barriers Identified   Annice Needy, LCSW 05/08/2015, 12:13 PM [phone]

## 2015-05-08 NOTE — Evaluation (Signed)
Clinical/Bedside Swallow Evaluation Patient Details  Name: Richard Hays MRN: [id number] Date of Birth: 1926-02-08  Today's Date: 05/08/2015 Time: SLP Start Time (ACUTE ONLY): 1531 SLP Stop Time (ACUTE ONLY): 1559 SLP Time Calculation (min) (ACUTE ONLY): 28 min  Past Medical History:  Past Medical History  Diagnosis Date  . Dementia   . Diabetes mellitus without complication (HCC)   . Hypertension   . UTI (lower urinary tract infection)   . Dementia 05/07/2015   Past Surgical History: History reviewed. No pertinent past surgical history. HPI:  Pt is an 81 year old man who was diagnosed with dementia probably 5 years ago. He has been deteriorating since November 2015. His usual state is that he is unable to walk, requires help with all activities of daily living including dressing, bathing, feeding. He has become largely nonverbal now, especially in the last couple of months. He also has had difficulty swallowing his food and his wife notices that his food tends to stay in his mouth. He now presents with 2-3 day history of decreased by mouth intake and foul-smelling urine as well as more lethargy. CT of head negative for acute changes, chest x ray also with no acute abnormalities. ST consulted to evaluate current swallow function    Assessment / Plan / Recommendation Clinical Impression  Pt presents with mild to moderate oropharyngeal dysphagia. Family reports worsening dentition limiting ability for toleration of solid PO. Family also states pt with no intake of lunch meal this date due to toughness of meat. Oral motor exam unremarkable yet limited by pts mentation and inability to follow commands. No overt signs or symtpoms of reduced airway protection with any PO. Mild oral holding of thin liquid bolus noted in addition to multiple swallows, likely impacted by cognitive factors. Educated family regarding PO diet consistency options. Given risk for weight loss and poor tolerance of dysphagia  3 (mechanical soft) diet, recommend conservative dysphagia 1 (puree) diet with thin liquids to maximize intake. Pt would also benefit from dietitian consult given history of fluctuating intake and dehydration.  Recommend medicines crushed with puree. Full supervision and feeding assistance warranted with all PO.   ST to follow up for diet tolerance.    Aspiration Risk  Mild    Diet Recommendation Dysphagia 1 (Puree);Thin   Medication Administration: Crushed with puree Compensations: Slow rate;Small sips/bites;Check for pocketing    Other  Recommendations Oral Care Recommendations: Oral care BID   Follow Up Recommendations       Frequency and Duration min 2x/week  1 week   Pertinent Vitals/Pain     SLP Swallow Goals     Swallow Study Prior Functional Status       General Date of Onset: 05/07/15 Other Pertinent Information: Pt is an 81 year old man who was diagnosed with dementia probably 5 years ago. He has been deteriorating since November 2015. His usual state is that he is unable to walk, requires help with all activities of daily living including dressing, bathing, feeding. He has become largely nonverbal now, especially in the last couple of months. He also has had difficulty swallowing his food and his wife notices that his food tends to stay in his mouth. He now presents with 2-3 day history of decreased by mouth intake and foul-smelling urine as well as more lethargy. CT of head negative for acute changes, chest x ray also with no acute abnormalities. ST consulted to evaluate current swallow function  Type of Study: Bedside swallow evaluation Diet Prior to this Study: Dysphagia  3 (soft);Thin liquids Temperature Spikes Noted: No Respiratory Status: Room air Behavior/Cognition: Lethargic/Drowsy;Doesn't follow directions;Requires cueing Oral Cavity - Dentition: Missing dentition Self-Feeding Abilities: Total assist Patient Positioning: Upright in bed Baseline Vocal Quality:  Low vocal intensity Volitional Cough: Cognitively unable to elicit Volitional Swallow: Unable to elicit    Oral/Motor/Sensory Function Overall Oral Motor/Sensory Function: Appears within functional limits for tasks assessed   Ice Chips Ice chips: Within functional limits   Thin Liquid Thin Liquid: Impaired Presentation: Cup;Straw;Spoon Oral Phase Functional Implications: Prolonged oral transit Pharyngeal  Phase Impairments: Multiple swallows    Nectar Thick Nectar Thick Liquid: Not tested   Honey Thick Honey Thick Liquid: Not tested   Puree Puree: Impaired Oral Phase Impairments: Reduced lingual movement/coordination Oral Phase Functional Implications: Prolonged oral transit Pharyngeal Phase Impairments: Multiple swallows;Suspected delayed Swallow   Solid   GO    Solid: Impaired Oral Phase Impairments: Impaired mastication;Impaired anterior to posterior transit;Reduced lingual movement/coordination Oral Phase Functional Implications: Oral holding      Marcene Duos MA, CCC-SLP Acute Care Speech Language Pathologist    Marcene Duos E 05/08/2015,4:10 PM

## 2015-05-08 NOTE — Progress Notes (Signed)
[facility] PROGRESS NOP[place] Dressers Hamidi WUJ:[id number] DOB: 1925-12-14 DOA: 05/07/2015 PCP: Colette Ribas, MD  Summary 81 y.o. male with history of advanced dementia, DM type 2, HTN and recurrent UTI presented to the hospital with increased weakness and lethargy. While in the ED, patient was found to be deydrated with mild leukocytosis and elevated creatinine and BUN. CT head and CXR was unremarkable. His UA indicated possible infection and UC is in process. Patient was admitted and evaluated for further management.  Assessment/Plan 1. Dehydration. Will continue IVF and abx. Continue to monitor. 2. Lactic acidosis, likely related to dehydration with concomitant metformin use. Will continue IVF and hold metformin. Lactic acid is trending down. 3. Acute encephalopathy, likely related to dehydration and possible UTI. Appears to be slowly improving with treatment. Continue to monitor. 4. Hyponatremia. Likely related to volume depletion. Will replete.  5. AKI on CDK stage III secondary to dehydration. Creatinine 2.02 on admission and BUN was 55. This is improving with IVF. Continue current treatments. Continue to hold ACE inhibitor. 6. Possible UTI. UC in process. Continue intravenous Rocephin. 7. Diabetes. Hold metformin. Sliding scale of insulin. 8. Hypertension. BP are currnetly running low in the setting of dehydration. Hold ACE inhibitor. Monitor blood pressure closely. 9. End-stage dementia. PMT consultation has been requested for goals of care. His palliative performance score is 30% or less. Prognosis appears to be poor. 10. BPH. Resume Flomax once BP have stabilized.  Code Status: Full DVT Prophylaxis Lovenox Family discussion: Discussed plan in detail with patient and daughter. No further concerns at this time. Disposition Plan: Anticipate discharge within 1-2 days   Consultants:  PMT  Procedures:    Antibiotics:  Rocephin 10/10>>  HPI/Subjective: Patient is  currently sleeping. Per daughter, patient had weakness with recent decreased responsiveness but has an appropriate appetite. He is able to discern family members although signs of dementia is noted per family. He ambulates with cane at baseline. Denies any recent fevers.  Objective: Filed Vitals:   05/08/15 0940  BP:   Pulse: 88  Temp:   Resp:     Intake/Output Summary (Last 24 hours) at 05/08/15 1029 Last data filed at 05/08/15 0628  Gross per 24 hour  Intake    965 ml  Output    300 ml  Net    665 ml   Filed Weights   05/07/15 2057  Weight: 62.3 kg (137 lb 5.6 oz)    Exam:  General:  Appears somnolent, no signs of distress. Cardiovascular: RRR, no m/r/g. No LE edema. Respiratory: CTA bilaterally, no w/r/r. Normal respiratory effort. Abdomen: soft, ntnd Musculoskeletal: grossly normal tone BUE/BLE Psychiatric: grossly normal mood and affect, speech fluent and appropriate    Data Reviewed: Basic Metabolic Panel:  Recent Labs Lab 05/07/15 1605 05/08/15 0159  NA 132* 134*  K 4.6 4.4  CL 104 107  CO2 16* 18*  GLUCOSE 256* 221*  BUN 55* 51*  CREATININE 2.02* 1.68*  CALCIUM 8.7* 8.0*   Liver Function Tests:  Recent Labs Lab 05/07/15 1605 05/08/15 0159  AST 30 25  ALT 16* 14*  ALKPHOS 75 64  BILITOT 1.0 0.7  PROT 8.0 6.9  ALBUMIN 4.2 3.5   CBC:  Recent Labs Lab 05/07/15 1605 05/08/15 0159  WBC 11.2* 9.2  NEUTROABS 7.6  --   HGB 14.3 12.9*  HCT 42.3 38.7*  MCV 85.5 85.8  PLT 270 229   Cardiac Enzymes:  Recent Labs Lab 05/07/15 1605  TROPONINI <0.03  BNP (last 3 results)  Recent Labs  12/22/14 0127 02/21/15 1748  BNP 22.0 42.0    ProBNP (last 3 results)  CBG:  Recent Labs Lab 05/07/15 2027 05/08/15 0742  GLUCAP 201* 216*    Recent Results (from the past 240 hour(s))  Urine culture     Status: None (Preliminary result)   Collection Time: 05/07/15  5:02 PM  Result Value Ref Range Status   Specimen Description URINE,  CATHETERIZED  Final   Special Requests NONE  Final   Culture   Final    NO GROWTH < 12 HOURS Performed at [facility]    Report Status PENDING  Incomplete     Studies: Ct Head Wo Contrast  05/07/2015   CLINICAL DATA:  81 year old male with generalized weakness, loss of appetite, foul smelling urine for 3 days. Initial encounter.  EXAM: CT HEAD WITHOUT CONTRAST  TECHNIQUE: Contiguous axial images were obtained from the base of the skull through the vertex without intravenous contrast.  COMPARISON:  12/23/2014 and earlier.  FINDINGS: Visualized paranasal sinuses and mastoids remain clear. No acute osseous abnormality identified. Visualized orbits and scalp soft tissues are within normal limits.  Calcified atherosclerosis at the skull base. Cerebral volume is stable. No ventriculomegaly. No midline shift, mass effect, or evidence of intracranial mass lesion. No suspicious intracranial vascular hyperdensity. Gray-white matter differentiation is stable throughout the brain. No cortically based acute infarct identified. No acute intracranial hemorrhage identified.  IMPRESSION: Stable non contrast CT appearance of the brain, largely unremarkable for age.   Electronically Signed   By: Odessa Fleming M.D.   On: 05/07/2015 18:12   Dg Chest Port 1 View  05/07/2015   CLINICAL DATA:  Generalized weakness and decreased appetite  EXAM: PORTABLE CHEST - 1 VIEW  COMPARISON:  01/19/2015  FINDINGS: Cardiac shadow is within normal limits. The lungs are clear bilaterally. No acute bony abnormality is seen.  IMPRESSION: No acute abnormality noted.   Electronically Signed   By: Alcide Clever M.D.   On: 05/07/2015 16:57    Scheduled Meds: . ALPRAZolam  0.25 mg Oral QHS  . aspirin EC  325 mg Oral q morning - 10a  . atorvastatin  40 mg Oral QHS  . cefTRIAXone (ROCEPHIN)  IV  1 g Intravenous Q24H  . digoxin  0.0625 mg Oral Daily  . donepezil  10 mg Oral QHS  . enoxaparin (LOVENOX) injection  30 mg Subcutaneous Q24H   . escitalopram  5 mg Oral QHS  . finasteride  5 mg Oral Daily  . insulin aspart  0-5 Units Subcutaneous QHS  . insulin aspart  0-9 Units Subcutaneous TID WC  . megestrol  40 mg Oral QODAY  . tamsulosin  0.8 mg Oral QHS   Continuous Infusions: . sodium chloride 100 mL/hr at 05/07/15 2119    Active Problems:   Diabetes [facility])   Essential hypertension   UTI (lower urinary tract infection)   Dehydration   Dementia    Time spent: 25 minutes  By signing my name below, I, Edman Circle attest that this documentation has been prepared under the direction and in the presence of Erick Blinks, MD  Electronically signed: Edman Circle  05/08/2015 11:57 AM  Erick Blinks, M.D.  [facility] Pager [phone]. If 7PM-7AM, please contact night-coverage at [email or web address], password [facility] 05/08/2015, 10:29 AM  LOS: 1 day    I, Dr. Erick Blinks, personally performed the services described in this documentaiton. All medical record entries made by the  scribe were at my direction and in my presence. I have reviewed the chart and agree that the record reflects my personal performance and is accurate and complete  Erick Blinks, MD, 05/08/2015 8:05 PM

## 2015-05-09 DIAGNOSIS — E860 Dehydration: Secondary | ICD-10-CM

## 2015-05-09 DIAGNOSIS — G9340 Encephalopathy, unspecified: Secondary | ICD-10-CM

## 2015-05-09 DIAGNOSIS — Z515 Encounter for palliative care: Secondary | ICD-10-CM

## 2015-05-09 DIAGNOSIS — F0390 Unspecified dementia without behavioral disturbance: Secondary | ICD-10-CM

## 2015-05-09 DIAGNOSIS — N179 Acute kidney failure, unspecified: Principal | ICD-10-CM

## 2015-05-09 DIAGNOSIS — N183 Chronic kidney disease, stage 3 (moderate): Secondary | ICD-10-CM

## 2015-05-09 DIAGNOSIS — Z7189 Other specified counseling: Secondary | ICD-10-CM

## 2015-05-09 LAB — CBC
HCT: 33.5 % — ABNORMAL LOW (ref 39.0–52.0)
HEMOGLOBIN: 11 g/dL — AB (ref 13.0–17.0)
MCH: 28.2 pg (ref 26.0–34.0)
MCHC: 32.8 g/dL (ref 30.0–36.0)
MCV: 85.9 fL (ref 78.0–100.0)
Platelets: 212 10*3/uL (ref 150–400)
RBC: 3.9 MIL/uL — ABNORMAL LOW (ref 4.22–5.81)
RDW: 14.4 % (ref 11.5–15.5)
WBC: 7.6 10*3/uL (ref 4.0–10.5)

## 2015-05-09 LAB — BASIC METABOLIC PANEL
Anion gap: 3 — ABNORMAL LOW (ref 5–15)
BUN: 36 mg/dL — AB (ref 6–20)
CALCIUM: 8.3 mg/dL — AB (ref 8.9–10.3)
CO2: 20 mmol/L — AB (ref 22–32)
CREATININE: 1.35 mg/dL — AB (ref 0.61–1.24)
Chloride: 114 mmol/L — ABNORMAL HIGH (ref 101–111)
GFR calc Af Amer: 53 mL/min — ABNORMAL LOW (ref >60)
GFR calc non Af Amer: 46 mL/min — ABNORMAL LOW (ref >60)
GLUCOSE: 177 mg/dL — AB (ref 65–99)
Potassium: 3.9 mmol/L (ref 3.5–5.1)
Sodium: 137 mmol/L (ref 135–145)

## 2015-05-09 LAB — GLUCOSE, CAPILLARY
GLUCOSE-CAPILLARY: 162 mg/dL — AB (ref 65–99)
GLUCOSE-CAPILLARY: 158 mg/dL — AB (ref 65–99)
Glucose-Capillary: 154 mg/dL — ABNORMAL HIGH (ref 65–99)

## 2015-05-09 MED ORDER — ENSURE ENLIVE PO LIQD
237.0000 mL | Freq: Two times a day (BID) | ORAL | Status: DC
  Administered 2015-05-10: 237 mL via ORAL

## 2015-05-09 NOTE — Progress Notes (Signed)
PROGRESS NOTE  Richard Hays WUJ:[id number] DOB: 06/04/1926 DOA: 05/07/2015 PCP: Colette Ribas, MD  Summary: 81 y.o. male with history of advanced dementia, DM type 2, HTN and recurrent UTI presented to the hospital with increased weakness and lethargy. While in the ED, patient was found to be deydrated with mild leukocytosis and elevated creatinine and BUN. CT head and CXR was unremarkable.   Assessment/Plan: 1. AKI with nonAG metabolic acidosis superimposed on CKD stage III secondary to FTT, poor oral intake, dehydration, secondary to dementia. Improving with IVF.  2. Dehydration, secondary to FTT, improving with IVF.   3. Lactic acidosis, likely related to dehydration with concomitant metformin use. Resolved.   4. Acute encephalopathy superimposed on dementia, likely related to dehydration. Appears resolved.  5. Hyponatremia. Likely related to volume depletion. Repleted. 6. Possible UTI--UC negative, U/a not highly suggestive. Stop abx. 7. Diabetes. Sliding scale of insulin. 8. Essential Hypertension. Stable. Continue antihypertensives and monitor. 9. End-stage dementia. PMT consultation appreciated.  10. BPH. Resume Flomax once BP have stabilized.   Overall improving, tolerating diet, renal function appears to be at baseline.  Discussed with wife and daughter at bedside. They confirm d/w PMT and plan on home with hospice.   Anticipate discharge home 10/13.  Code Status: Full DVT Prophylaxis Lovenox Family Communication:  Disposition Plan: home with hospice  [name], MD  [facility]  Pager [phone] If 7PM-7AM, please contact night-coverage at [email or web address], password [facility] 05/09/2015, 8:19 AM  LOS: 2 days   Consultants:  PMT  ST Dysphagia 1 (Puree);Thin   Medication Administration: Crushed with puree Compensations: Slow rate;Small sips/bites;Check for pocketing   Procedures:    Antibiotics:  Rocephin 10/10>>10/12  HPI/Subjective: Wife  and daughter report eating better today. No new concerns.  Objective: Filed Vitals:   05/08/15 0940 05/08/15 1427 05/08/15 2217 05/09/15 0537  BP:  127/66 122/84 122/74  Pulse: 88 87 88 78  Temp:  97.9 F (36.6 C) 97.8 F (36.6 C) 98.2 F (36.8 C)  TempSrc:  Oral Oral Oral  Resp:  Height:      Weight:      SpO2:  100% 100% 100%    Intake/Output Summary (Last 24 hours) at 05/09/15 0819 Last data filed at 05/08/15 1753  Gross per 24 hour  Intake    240 ml  Output      0 ml  Net    240 ml     Filed Weights   05/07/15 2057  Weight: 62.3 kg (137 lb 5.6 oz)    Exam:     VSS, afebrile, not hypoxic General:  Appears calm and comfortable Cardiovascular: RRR, no m/r/g. No LE edema. Respiratory: CTA bilaterally, no w/r/r. Normal respiratory effort. Psychiatric: responds to questions with brief answers, not always appropriate  New data reviewed:  Hgb appears to be at baseline  Creatinine trending down, 1.35  CO2 up to 20  Pertinent data since admission:  Initial Lactic acid 2.7  WBC 11.2  CXR; no acute abnormality  Head CT unremarkable   Pending data:    Scheduled Meds: . ALPRAZolam  0.25 mg Oral QHS  . aspirin EC  325 mg Oral q morning - 10a  . atorvastatin  40 mg Oral QHS  . cefTRIAXone (ROCEPHIN)  IV  1 g Intravenous Q24H  . digoxin  0.0625 mg Oral Daily  . donepezil  10 mg Oral QHS  . enoxaparin (LOVENOX) injection  30 mg Subcutaneous Q24H  . escitalopram  5 mg  Oral QHS  . finasteride  5 mg Oral Daily  . insulin aspart  0-5 Units Subcutaneous QHS  . insulin aspart  0-9 Units Subcutaneous TID WC  . megestrol  40 mg Oral QODAY  . tamsulosin  0.8 mg Oral QHS   Continuous Infusions: . sodium chloride 1 mL (05/08/15 1907)    Principal Problem:   AKI (acute kidney injury) (HCC) Active Problems:   Diabetes (HCC)   Essential hypertension   UTI (lower urinary tract infection)   Dehydration   Dementia   Lactic acidosis   CKD (chronic  kidney disease) stage 3, GFR 30-59 ml/min   Palliative care encounter   DNR (do not resuscitate) discussion   Time spent 20 minutes   By signing my name below, I, Burnett HarryJennifer Gregorio attest that this documentation has been prepared under the direction and in the presence of Brendia Sacksaniel Aysa Larivee, MD Electronically signed: Burnett HarryJennifer Gregorio, Scribe.  05/09/2015  I personally performed the services described in this documentation. All medical record entries made by the scribe were at my direction. I have reviewed the chart and agree that the record reflects my personal performance and is accurate and complete. Brendia Sacksaniel Havannah Streat, MD

## 2015-05-09 NOTE — Consult Note (Signed)
Consultation Note Date: 05/09/2015   Patient Name: Richard Hays  DOB: 09-03-1925  MRN: [id number]  Age / Sex: 81 y.o., male   PCP: Assunta Found, MD Referring Physician: Standley Brooking, MD  Reason for Consultation: Establishing goals of care and Psychosocial/spiritual support  Palliative Care Assessment and Plan Summary of Established Goals of Care and Medical Treatment Preferences   Clinical Assessment/Narrative: Mr. Richard Hays is resting quietly in bed.  He does briefly make eye contact when I greet him, but quickly closes his eyes.  His daughter, Clarene Reamer, SW is at his bedside.  His wife has left to get medications from [place].  Debra and I talk at length about the dementia trajectory related to food and intake, immobility and related complications.  We talk about Mr. Kras declines including incontinence, no longer walking, and trouble with transferring now.  We discuss his need to have 11 teeth removed and her concern that this would put too much strain on him.  (along with the suggestions from urology that he have his prostate shaved). She tells me that he has had about a 30 lb weight loss in the last year, and how her father is not eating much. Her mother told her that he says to stop pushing food.  Stanton Kidney tells me that her mother thinks she is in denial about her fathers declines, but she shares her worry that he doesn't know how supportive he has been for her. Stanton Kidney tells me about her brother, Lyman Bishop and his 2 year rehab. He will return to live in the family home after November 13.  Stanton Kidney states she has committed to providing financial support to help pay caregivers at home.  She is open to hospice, but unsure if her mother will be. I share the difference between services provided by [facility] vs Hospice.  Mrs. Lewellen arrives and we talk about his declines that have occurred and are expected.  We talk about code status, (she has made HCPOA and AD, but Mr. Stockley declined to do so).  Mrs. Beitz  states No feeding tube, and that she would like to allow a natural death.  We talk about how they will manage at home, and I bring up the subject of hospice.  Mrs. Tawil asks about services and cost and states she will accept Hospice in the home.  Mrs. Winemiller seems to be realistic about the eventuality of Mr. Esguerra death, but will benefit from the support of Hospice.    Contacts/Participants in Discussion: Primary Decision Maker: Mr. Richard Hays is unable to make his own decisions at this time. Wife and children make decisions together.  HCPOA: no  No conflict within family regarding goals of care.   Code Status/Advance Care Planning:  DNR today  Wife states she would like to "allow a natural death"  NO feeding tube, YES to IV fluids and antibiotics at this time.   Symptom Management:   Xanax 0.25 mg PO Q HS  Zofran 6 mg PO/IV Q 6 hours PRN  Palliative Prophylaxis: None at this time.   Psycho-social/Spiritual:   Support System: Lives with wife of 66 years, who is also 87 years. Daughter lives 40 minutes away. Son in [place], scheduled to return to live in the home after November 13.   Desire for further Chaplaincy support: Continues ongoing.   Prognosis: < 6 months likely d/t advancing dementia and dehydration.  Discharge Planning:  Home with Hospice       Chief Complaint: Weakness History of Present  Illness: Richard Hays is a 81 y.o. male  This is an 81 year old man who was diagnosed with dementia probably 5 years ago. He has been deteriorating since November 2015. His usual state is that he is unable to walk, requires help with all activities of daily living including dressing, bathing, feeding. He has become largely nonverbal now, especially in the last couple of months. He also has had difficulty swallowing his food and his wife notices that his food tends to stay in his mouth. He now presents with 2-3 day history of decreased by mouth intake and foul-smelling urine as well as  more lethargy. He is now being admitted for further investigation and management.  He has been found to have UTI and dehydration and has been supported with fluids, but is now stable for d/c.    Primary Diagnoses  Present on Admission:  . Dehydration . Essential hypertension . UTI (lower urinary tract infection) . Dementia . Lactic acidosis . Acute encephalopathy . AKI (acute kidney injury) (HCC) . CKD (chronic kidney disease) stage 3, GFR 30-59 ml/min  Palliative Review of Systems: [name] does not respond to questions regarding ROS, but there are no s/s of pain/anxiety.  I have reviewed the medical record, interviewed the patient and family, and examined the patient. The following aspects are pertinent.  Past Medical History  Diagnosis Date  . Dementia   . Diabetes mellitus without complication (HCC)   . Hypertension   . UTI (lower urinary tract infection)   . Dementia 05/07/2015   Social History   Social History  . Marital Status: Married    Spouse Name: Richard Hays  . Number of Children: Richard Hays  . Years of Education: Richard Hays   Social History Main Topics  . Smoking status: Former Games developer  . Smokeless tobacco: None  . Alcohol Use: No  . Drug Use: No  . Sexual Activity: Not Asked   Other Topics Concern  . None   Social History Narrative   History reviewed. No pertinent family history. Scheduled Meds: . ALPRAZolam  0.25 mg Oral QHS  . aspirin EC  325 mg Oral q morning - 10a  . atorvastatin  40 mg Oral QHS  . cefTRIAXone (ROCEPHIN)  IV  1 g Intravenous Q24H  . digoxin  0.0625 mg Oral Daily  . donepezil  10 mg Oral QHS  . enoxaparin (LOVENOX) injection  30 mg Subcutaneous Q24H  . escitalopram  5 mg Oral QHS  . feeding supplement (ENSURE ENLIVE)  237 mL Oral BID BM  . finasteride  5 mg Oral Daily  . insulin aspart  0-5 Units Subcutaneous QHS  . insulin aspart  0-9 Units Subcutaneous TID WC  . megestrol  40 mg Oral QODAY  . tamsulosin  0.8 mg Oral QHS   Continuous  Infusions: . sodium chloride 1 mL (05/08/15 1907)   PRN Meds:.ondansetron **OR** ondansetron (ZOFRAN) IV Medications Prior to Admission:  Prior to Admission medications   Medication Sig Start Date End Date Taking? Authorizing Provider  acetaminophen-codeine (TYLENOL #3) 300-30 MG tablet Take 1 tablet by mouth daily as needed for moderate pain.  03/12/15  Yes Historical Provider, MD  ALPRAZolam Prudy Feeler) 0.25 MG tablet Take 0.25 mg by mouth at bedtime.   Yes Historical Provider, MD  aspirin EC 325 MG tablet Take 325 mg by mouth every morning.   Yes Historical Provider, MD  atorvastatin (LIPITOR) 40 MG tablet Take 40 mg by mouth at bedtime. 02/18/14  Yes Historical Provider, MD  digoxin Margit Banda)  0.125 MG tablet Take 0.0625 mg by mouth daily.    Yes Historical Provider, MD  donepezil (ARICEPT) 10 MG tablet Take 10 mg by mouth at bedtime. 02/18/14  Yes Historical Provider, MD  escitalopram (LEXAPRO) 10 MG tablet Take 5 mg by mouth at bedtime.  12/19/14  Yes Historical Provider, MD  finasteride (PROSCAR) 5 MG tablet Take 5 mg by mouth daily.   Yes Historical Provider, MD  furosemide (LASIX) 20 MG tablet Take 20 mg by mouth daily as needed for fluid.   Yes Historical Provider, MD  glipiZIDE-metformin (METAGLIP) 2.5-500 MG per tablet Take 0.5 tablets by mouth daily.    Yes Historical Provider, MD  lisinopril (PRINIVIL,ZESTRIL) 5 MG tablet Take 1 tablet by mouth daily. 10/23/14  Yes Historical Provider, MD  megestrol (MEGACE) 40 MG tablet Take 40 mg by mouth every other day.    Yes Historical Provider, MD  tamsulosin (FLOMAX) 0.4 MG CAPS capsule Take 0.8 mg by mouth at bedtime.    Yes Historical Provider, MD  cephALEXin (KEFLEX) 500 MG capsule Take 1 capsule (500 mg total) by mouth 3 (three) times daily. Patient not taking: Reported on 05/07/2015 02/24/15   Henderson Cloud, MD  ibuprofen (ADVIL,MOTRIN) 800 MG tablet Take 1 tablet by mouth daily as needed. 03/12/15   Historical Provider, MD    Allergies  Allergen Reactions  . Sulfa Antibiotics Hives and Itching   CBC:    Component Value Date/Time   WBC 7.6 05/09/2015 0545   HGB 11.0* 05/09/2015 0545   HCT 33.5* 05/09/2015 0545   PLT 212 05/09/2015 0545   MCV 85.9 05/09/2015 0545   NEUTROABS 7.6 05/07/2015 1605   LYMPHSABS 2.0 05/07/2015 1605   MONOABS 0.8 05/07/2015 1605   EOSABS 0.8* 05/07/2015 1605   BASOSABS 0.1 05/07/2015 1605   Comprehensive Metabolic Panel:    Component Value Date/Time   NA 137 05/09/2015 0545   K 3.9 05/09/2015 0545   CL 114* 05/09/2015 0545   CO2 20* 05/09/2015 0545   BUN 36* 05/09/2015 0545   CREATININE 1.35* 05/09/2015 0545   GLUCOSE 177* 05/09/2015 0545   CALCIUM 8.3* 05/09/2015 0545   AST 25 05/08/2015 0159   ALT 14* 05/08/2015 0159   ALKPHOS 64 05/08/2015 0159   BILITOT 0.7 05/08/2015 0159   PROT 6.9 05/08/2015 0159   ALBUMIN 3.5 05/08/2015 0159    Physical Exam: Vital Signs: BP 122/74 mmHg  Pulse 78  Temp(Src) 98.2 F (36.8 C) (Oral)  Resp 18  Ht 5\' 6"  (1.676 m)  Wt 62.3 kg (137 lb 5.6 oz)  BMI 22.18 kg/m2  SpO2 97% SpO2: SpO2: 97 % O2 Device: O2 Device: Not Delivered O2 Flow Rate:   Intake/output summary:  Intake/Output Summary (Last 24 hours) at 05/09/15 1629 Last data filed at 05/08/15 1753  Gross per 24 hour  Intake    240 ml  Output      0 ml  Net    240 ml   LBM: Last BM Date: 05/08/15 Baseline Weight: Weight: 62.3 kg (137 lb 5.6 oz) Most recent weight: Weight: 62.3 kg (137 lb 5.6 oz)  Exam Findings:  Constitutional:  Elderly, frail, lying in bed.  Resp:  Even and non labored GI:  abd soft non tender.  Psych: calm, opens eyes when asked 50% of time.          Palliative Performance Scale:            PPS 3 months ago: 50% PPS today:  30%  Additional Data Reviewed: Recent Labs     05/08/15  0159  05/09/15  0545  WBC  9.2  7.6  HGB  12.9*  11.0*  PLT  229  212  NA  134*  137  BUN  51*  36*  CREATININE  1.68*  1.35*     Time In: 1300   Time Out: 1530 Time Total: 150 minutes Greater than 50%  of this time was spent counseling and coordinating care related to the above assessment and plan.  Signed by: Katheran Aweove,Tasha A, NP  Katheran Aweasha A Dove, NP  05/09/2015, 4:29 PM  Please contact Palliative Medicine Team phone at [phone] for questions and concerns.

## 2015-05-10 DIAGNOSIS — E43 Unspecified severe protein-calorie malnutrition: Secondary | ICD-10-CM

## 2015-05-10 LAB — GLUCOSE, CAPILLARY
GLUCOSE-CAPILLARY: 200 mg/dL — AB (ref 65–99)
GLUCOSE-CAPILLARY: 137 mg/dL — AB (ref 65–99)
Glucose-Capillary: 174 mg/dL — ABNORMAL HIGH (ref 65–99)

## 2015-05-10 MED ORDER — ENSURE ENLIVE PO LIQD: 237.0000 mL | Freq: Two times a day (BID) | ORAL | Status: AC

## 2015-05-10 NOTE — Progress Notes (Signed)
Initial Nutrition Assessment  DOCUMENTATION CODES:   Severe malnutrition in context of chronic illness  INTERVENTION:  Ensure Enlive po BID, each supplement provides 350 kcal and 20 grams of protein   Dysphagia 1 diet with thin liquids  Staff assist with feeding / gentle encouragement for intake   NUTRITION DIAGNOSIS:   Inadequate oral intake related to chronic illness as evidenced by per patient/family report.  GOAL:    Engineer, petroleum(Honor pt/family wishs for natural death and offer foods as desired.)   MONITOR:    (Comfort foods ad lib)  REASON FOR ASSESSMENT: poor po intake and weight loss  Malnutrition Screening Tool    ASSESSMENT: Pt from home with wife. Has hx of advanced dementia and has been treated for bronchitis over the past 2 weeks. She is present and provided hx and is his primary caregiver. Prior to his acute illness she would prepare hot breakfast eggs, oatmeal, toast, juice around 9-10 am and then they would eat again 1-2 pm chicken, pork or fish with mashed potatoes and green vegetable with pepsi to drink. They typically only eat 2 meals daily and have snack at night if needed. She says since he had been sick recently it's been more and more difficult to get him to eat. We talked about using oral supplements to increase his nutrition intake and noted that weight loss and decreased oral intake are often a part of dementia disease progression. Patient has severe wt loss 11% in <90 days. He has been started on  an appetite stimulant. According to palliative care note family is not wishing aggressive nutrition care such as feeding tube placement.  He has moderate loss of fat mass upper arms and muscle wasting to clavicles, acromion regions. His energy intake </= 75% for >/= 1 month.  Diet Order:  DIET - DYS 1 Room service appropriate?: Yes; Fluid consistency:: Thin  Skin:   dry and intact  Last BM:   10/12  Height:   Ht Readings from Last 1 Encounters:  05/07/15 5\' 6"   (1.676 m)    Weight:   Wt Readings from Last 1 Encounters:  05/07/15 137 lb 5.6 oz (62.3 kg)    Ideal Body Weight:   64.5 kg  BMI:  Body mass index is 22.18 kg/(m^2).  Estimated Nutritional Needs:   Kcal:  1860-2170  Protein:  80-90 gr  Fluid:  >1800 ml   EDUCATION NEEDS:     Royann ShiversLynn Brando Taves MS,RD,CSG,LDN Office: [phone] Pager: [phone]

## 2015-05-10 NOTE — Care Management Important Message (Signed)
Important Message  Patient Details  Name: Richard Hays MRN: [id number] Date of Birth: Sep 08, 1925   Medicare Important Message Given:  Yes-second notification given    Malcolm MetroChildress, Nykerria Macconnell Demske, RN 05/10/2015, 12:12 PM

## 2015-05-10 NOTE — Care Management Note (Signed)
Case Management Note  Patient Details  Name: Richard Hays MRN: [id number] Date of Birth: 1926-01-05  Expected Discharge Date:                  Expected Discharge Plan:  Home w Home Health Services  In-House Referral:  Clinical Social Work, Hospice / Palliative Care  Discharge planning Services  CM Consult  Post Acute Care Choice:  Home Health Choice offered to:  Spouse  DME Arranged:    DME Agency:     HH Arranged:  RN, PT, Nurse's Aide HH Agency:  [facility]  Status of Service:  Completed, signed off  Medicare Important Message Given:  Yes-second notification given Date Medicare IM Given:    Medicare IM give by:    Date Additional Medicare IM Given:    Additional Medicare Important Message give by:     If discussed at Long Length of Stay Meetings, dates discussed:    Additional Comments: Pt discharging home today with wife. Per wife's wishes [facility]'s [facility] and [facility] does not meet criteria for hospice enrolment at this time. A Hospice rep will come see pt and speak to family today prior to pt leaving hospital to verify that he does not meet criteria and discuss supportive services with the family. [facility]). [facility] is families choice as they have used them in the past. Pt's wife states she has all necessary DME and is aware [facility] has 48 hours to make their first visit. No further CM needs anticipated.  Malcolm Metrohildress, Lekita Kerekes Demske, RN 05/10/2015, 12:18 PM

## 2015-05-10 NOTE — Progress Notes (Signed)
PROGRESS NOTE  Richard Hays GNF:[id number] DOB: 1925/11/07 DOA: 05/07/2015 PCP: Colette Ribas, MD  Summary: 81 y.o. male with history of advanced dementia, DM type 2, HTN and recurrent UTI presented to the hospital with increased weakness and lethargy. While in the ED, patient was found to be deydrated with mild leukocytosis and elevated creatinine and BUN. CT head and CXR was unremarkable.   Assessment/Plan: 1. AKI with non-AG metabolic acidosis superimposed on CKD stage III secondary to FTT, poor oral intake, dehydration, secondary to dementia. Resolved with IVF.  2. Dehydration, secondary to FTT, resolved with IVF.   3. Lactic acidosis, likely related to dehydration with concomitant metformin use. Resolved.   4. Acute encephalopathy superimposed on dementia, secondary to dehydration.Resolved.  5. Hyponatremia. Likely related to dehydration. Repleted. 6. Possible UTI--UC negative, U/a not highly suggestive. Abx discontinued 7. Diabetes. Remains stable. 8. Essential hypertension. Stable.  9. End-stage dementia. PMT consulted and family wishes HH, hospice will evaluate as outpatient. 10. BPH. Resume Flomax once BP have stabilized. 11. Severe malnutrtion   Overall improving, tolerating diet. Discharge with home health with hospice to evaluate as an outpatient.   Code Status: DNR DVT Prophylaxis Lovenox Family Communication: Wife at bedside. Discussed with wife who understands and has no concerns at this time. Disposition Plan: home with hospice care  [name], MD  [facility]  Pager [phone] If 7PM-7AM, please contact night-coverage at [email or web address], password [facility] 05/10/2015, 8:01 AM  LOS: 3 days   Consultants:  PMT  ST Dysphagia 1 (Puree);Thin   Medication Administration: Crushed with puree Compensations: Slow rate;Small sips/bites;Check for pocketing   Procedures:    Antibiotics:  Rocephin 10/10>>10/12  HPI/Subjective: Per wife, patient the  same as yesterday. Doesn't have an appetite but that is typical for him. He slept well and ate a good dinner last night and a good breakfast today.  Objective: Filed Vitals:   05/09/15 1418 05/09/15 1631 05/09/15 2233 05/10/15 0640  BP:  123/65 124/57 145/79  Pulse:  68 71 71  Temp:  98.5 F (36.9 C) 98.9 F (37.2 C) 98.6 F (37 C)  TempSrc:  Oral Oral Oral  Resp:  Height:      Weight:      SpO2: 97% 99% 100% 100%    Intake/Output Summary (Last 24 hours) at 05/10/15 0801 Last data filed at 05/10/15 0642  Gross per 24 hour  Intake 4823.33 ml  Output    500 ml  Net 4323.33 ml     Filed Weights   05/07/15 2057  Weight: 62.3 kg (137 lb 5.6 oz)    Exam:     VSS, afebrile, not hypoxic General:  Appears calm and comfortable Cardiovascular: RRR, no m/r/g. No LE edema. Respiratory: CTA bilaterally, no w/r/r. Normal respiratory effort. Abdomen: soft, ntnd Psychiatric: confused, but answers simple questions Neurologic: grossly non-focal.  CBG stable  Scheduled Meds: . ALPRAZolam  0.25 mg Oral QHS  . aspirin EC  325 mg Oral q morning - 10a  . atorvastatin  40 mg Oral QHS  . digoxin  0.0625 mg Oral Daily  . donepezil  10 mg Oral QHS  . enoxaparin (LOVENOX) injection  30 mg Subcutaneous Q24H  . escitalopram  5 mg Oral QHS  . feeding supplement (ENSURE ENLIVE)  237 mL Oral BID BM  . finasteride  5 mg Oral Daily  . insulin aspart  0-5 Units Subcutaneous QHS  . insulin aspart  0-9 UnitLaird Runnioneous TID WC  . megestrol  40 mg Oral QODAY  . tamsulosin  0.8 mg Oral QHS   Continuous Infusions: . sodium chloride 100 mL/hr at 05/10/15 0327    Principal Problem:   AKI (acute kidney injury) (HCC) Active Problems:   Diabetes (HCC)   Essential hypertension   UTI (lower urinary tract infection)   Dehydration   Dementia   Lactic acidosis   CKD (chronic kidney disease) stage 3, GFR 30-59 ml/min   Palliative care encounter   DNR (do not resuscitate)  discussion    By signing my name below, I, Burnett HarryJennifer Gregorio attest that this documentation has been prepared under the direction and in the presence of Brendia Sacksaniel Trevan Messman, MD Electronically signed: Burnett HarryJennifer Gregorio, Scribe.  05/10/2015 12:02pm  I personally performed the services described in this documentation. All medical record entries made by the scribe were at my direction. I have reviewed the chart and agree that the record reflects my personal performance and is accurate and complete. Brendia Sacksaniel Marae Cottrell, MD

## 2015-05-10 NOTE — Discharge Summary (Addendum)
Physician Discharge Summary  Richard Hays JYN:[id number] DOB: 01-02-1926 DOA: 05/07/2015  PCP: Colette Ribas, MD  Admit date: 05/07/2015 Discharge date: 05/10/2015  Discharge with [facility], hospice will evaluate as an outpatient  Followup FTT, advanced dementia Long-term prognosis poor.  Follow-up Information    Follow up with Colette Ribas, MD. Schedule an appointment as soon as possible for a visit in 2 weeks.   Specialty:  Family Medicine   Contact information:   [address] [place] [address] [phone]       Follow up with [facility].   Contact information:   [address] [place] [address] [phone]      Discharge Diagnoses:  1. AKI with nonAG metabolic acidosis  2. FTT 3. Dehydration  4. CKD stage III   5. Lactic acidosis, likely related to dehydration   6. Acute encephalopathy superimposed on dementia, likely related to dehydration. 7. Hyponatremia. 8. Diabetes 9. Essential hypertension 10. End-stage dementia 11. Severe malnutrition  Discharge Condition: stable, long-term prognosis poor Disposition: Home with HH  Diet recommendation:  Dysphagia 1 (Puree);Thin  Medication Administration: Crushed with puree Compensations: Slow rate;Small sips/bites;Check for pocketing   Filed Weights   05/07/15 2057  Weight: 62.3 kg (137 lb 5.6 oz)    History of present illness:  81 y.o. male with history of advanced dementia, DM type 2, HTN and recurrent UTI presented to the hospital with increased weakness and lethargy. While in the ED, patient was found to be deydrated with mild leukocytosis and elevated creatinine and BUN. CT head and CXR was unremarkable.   Hospital Course:  Acute kidney injury resolved with IV fluids as stated dehydration. Oral intake improved with rehydration. There is no evidence of infection. Electrolyte abnormalities corrected. Acute illness felt to be related to advanced dementia and  failure to thrive. Patient seen by palliative care and patient was made DO NOT RESUSCITATE and hospice was considered but currently the patient is not hospice eligible. Long discussion with daughter and wife at bedside. Discussed natural progression of dementia and the likelihood of recurrent dehydration secondary to failure to thrive and long-term poor prognosis. They are opened hospice. Family desired discharge home with the patient has received excellent care.  1. AKI with non-AG metabolic acidosis superimposed on CKD stage III secondary to FTT, poor oral intake, dehydration, secondary to dementia. Resolved with IVF.  2. Dehydration, secondary to FTT, resolved with IVF.  3. Lactic acidosis, likely related to dehydration with concomitant metformin use. Resolved.  4. Acute encephalopathy superimposed on dementia, secondary to dehydration.Resolved.  5. Hyponatremia. Likely related to dehydration. Repleted. 6. Possible UTI--UC negative, U/a not highly suggestive. Abx discontinued 7. Diabetes. Remains stable. 8. Essential hypertension. Stable.  9. End-stage dementia. PMT consulted and family wishes HH, hospice will evaluate as outpatient. 10. BPH. Resume Flomax once BP have stabilized. 11. Severe malnutrtion  Consultants:  PMT  ST Dysphagia 1 (Puree);Thin   Medication Administration: Crushed with puree Compensations: Slow rate;Small sips/bites;Check for pocketing   Procedures:  none  Antibiotics:  Rocephin 10/10>>10/12  Discharge Instructions Discharge Instructions    Discharge instructions    Complete by:  As directed   Call your physician or seek immediate medical attention for fever, vomiting, not eating, pain or worsening of condition. Recommend Puree diet with thin liquids. Give medications crushed with puree. Feed slowly. Check for pocketing.            Discharge Medication List as of 05/10/2015  1:19 PM  START taking these medications   Details  feeding  supplement, ENSURE ENLIVE, (ENSURE ENLIVE) LIQD Take 237 mLs by mouth 2 (two) times daily between meals., Starting 05/10/2015, Until Discontinued, No Print      CONTINUE these medications which have NOT CHANGED   Details  ALPRAZolam (XANAX) 0.25 MG tablet Take 0.25 mg by mouth at bedtime., Until Discontinued, Historical Med    aspirin EC 325 MG tablet Take 325 mg by mouth every morning., Until Discontinued, Historical Med    atorvastatin (LIPITOR) 40 MG tablet Take 40 mg by mouth at bedtime., Starting 02/18/2014, Until Discontinued, Historical Med    digoxin (LANOXIN) 0.125 MG tablet Take 0.0625 mg by mouth daily. , Until Discontinued, Historical Med    donepezil (ARICEPT) 10 MG tablet Take 10 mg by mouth at bedtime., Starting 02/18/2014, Until Discontinued, Historical Med    escitalopram (LEXAPRO) 10 MG tablet Take 5 mg by mouth at bedtime. , Starting 12/19/2014, Until Discontinued, Historical Med    finasteride (PROSCAR) 5 MG tablet Take 5 mg by mouth daily., Until Discontinued, Historical Med    furosemide (LASIX) 20 MG tablet Take 20 mg by mouth daily as needed for fluid., Until Discontinued, Historical Med    glipiZIDE-metformin (METAGLIP) 2.5-500 MG per tablet Take 0.5 tablets by mouth daily. , Until Discontinued, Historical Med    megestrol (MEGACE) 40 MG tablet Take 40 mg by mouth every other day. , Until Discontinued, Historical Med    tamsulosin (FLOMAX) 0.4 MG CAPS capsule Take 0.8 mg by mouth at bedtime. , Until Discontinued, Historical Med      STOP taking these medications     acetaminophen-codeine (TYLENOL #3) 300-30 MG tablet      lisinopril (PRINIVIL,ZESTRIL) 5 MG tablet      cephALEXin (KEFLEX) 500 MG capsule      ibuprofen (ADVIL,MOTRIN) 800 MG tablet        Allergies  Allergen Reactions  . Sulfa Antibiotics Hives and Itching    The results of significant diagnostics from this hospitalization (including imaging, microbiology, ancillary and laboratory) are  listed below for reference.    Significant Diagnostic Studies: Ct Head Wo Contrast  05/07/2015  CLINICAL DATA:  81 year old male with generalized weakness, loss of appetite, foul smelling urine for 3 days. Initial encounter. EXAM: CT HEAD WITHOUT CONTRAST TECHNIQUE: Contiguous axial images were obtained from the base of the skull through the vertex without intravenous contrast. COMPARISON:  12/23/2014 and earlier. FINDINGS: Visualized paranasal sinuses and mastoids remain clear. No acute osseous abnormality identified. Visualized orbits and scalp soft tissues are within normal limits. Calcified atherosclerosis at the skull base. Cerebral volume is stable. No ventriculomegaly. No midline shift, mass effect, or evidence of intracranial mass lesion. No suspicious intracranial vascular hyperdensity. Gray-white matter differentiation is stable throughout the brain. No cortically based acute infarct identified. No acute intracranial hemorrhage identified. IMPRESSION: Stable non contrast CT appearance of the brain, largely unremarkable for age. Electronically Signed   By: Odessa FlemingH  Hall M.D.   On: 05/07/2015 18:12   Dg Chest Port 1 View  05/07/2015  CLINICAL DATA:  Generalized weakness and decreased appetite EXAM: PORTABLE CHEST - 1 VIEW COMPARISON:  01/19/2015 FINDINGS: Cardiac shadow is within normal limits. The lungs are clear bilaterally. No acute bony abnormality is seen. IMPRESSION: No acute abnormality noted. Electronically Signed   By: Alcide CleverMark  Lukens M.D.   On: 05/07/2015 16:57    Microbiology: Recent Results (from the past 240 hour(s))  Urine culture     Status: None  Collection Time: 05/07/15  5:02 PM  Result Value Ref Range Status   Specimen Description URINE, CATHETERIZED  Final   Special Requests NONE  Final   Culture   Final    4,000 COLONIES/mL INSIGNIFICANT GROWTH Performed at [facility]    Report Status 05/08/2015 FINAL  Final     Labs: Basic Metabolic Panel:  Recent Labs Lab  05/07/15 1605 05/08/15 0159 05/09/15 0545  NA 132* 134* 137  K 4.6 4.4 3.9  CL 104 107 114*  CO2 16* 18* 20*  GLUCOSE 256* 221* 177*  BUN 55* 51* 36*  CREATININE 2.02* 1.68* 1.35*  CALCIUM 8.7* 8.0* 8.3*   Liver Function Tests:  Recent Labs Lab 05/07/15 1605 05/08/15 0159  AST 30 25  ALT 16* 14*  ALKPHOS 75 64  BILITOT 1.0 0.7  PROT 8.0 6.9  ALBUMIN 4.2 3.5   CBC:  Recent Labs Lab 05/07/15 1605 05/08/15 0159 05/09/15 0545  WBC 11.2* 9.2 7.6  NEUTROABS 7.6  --   --   HGB 14.3 12.9* 11.0*  HCT 42.3 38.7* 33.5*  MCV 85.5 85.8 85.9  PLT 270 229 212   Cardiac Enzymes:  Recent Labs Lab 05/07/15 1605  TROPONINI <0.03    CBG:  Recent Labs Lab 05/08/15 2036 05/09/15 0734 05/09/15 1152 05/09/15 1630 05/09/15 2150  GLUCAP 195* 162* 154* 158* 200*    Principal Problem:   AKI (acute kidney injury) (HCC) Active Problems:   Diabetes (HCC)   Essential hypertension   UTI (lower urinary tract infection)   Dehydration   Dementia   Lactic acidosis   CKD (chronic kidney disease) stage 3, GFR 30-59 ml/min   Palliative care encounter   DNR (do not resuscitate) discussion   Protein-calorie malnutrition, severe   Time coordinating discharge: 35 minutes  Signed:  [name], MD [facility] 05/10/2015, 8:07 AM  By signing my name below, I, Burnett Harry attest that this documentation has been prepared under the direction and in the presence of [name], MD Electronically signed: Burnett Harry, Scribe.  05/10/2015 12:02pm  I personally performed the services described in this documentation. All medical record entries made by the scribe were at my direction. I have reviewed the chart and agree that the record reflects my personal performance and is accurate and complete. [name], MD

## 2015-05-10 NOTE — Progress Notes (Signed)
Hospice nurse into discuss hospice services and philosophy with pt's wife.  Discussed diagnosis that could be used by hospice services.  Plan is for this SN to review records to see if approiate diagnosis for hospice services can be found.  Copies of needed documents were obtained and will [facility].  SN will call pt's wife to inform her if pt can be admitted to hospice services.  Spoke with Shanda BumpsJessica, RN case manager to inform her that SN spoke with the pt's family.

## 2015-05-11 LAB — GLUCOSE, CAPILLARY: Glucose-Capillary: 134 mg/dL — ABNORMAL HIGH (ref 65–99)

## 2015-05-27 DIAGNOSIS — G20 Parkinson's disease: Secondary | ICD-10-CM | POA: Diagnosis not present

## 2015-05-27 DIAGNOSIS — G9340 Encephalopathy, unspecified: Secondary | ICD-10-CM | POA: Diagnosis not present

## 2015-06-08 DIAGNOSIS — R739 Hyperglycemia, unspecified: Secondary | ICD-10-CM | POA: Diagnosis not present

## 2015-06-08 DIAGNOSIS — R6889 Other general symptoms and signs: Secondary | ICD-10-CM | POA: Diagnosis not present

## 2015-06-27 DIAGNOSIS — G20 Parkinson's disease: Secondary | ICD-10-CM | POA: Diagnosis not present

## 2015-06-27 DIAGNOSIS — G9340 Encephalopathy, unspecified: Secondary | ICD-10-CM | POA: Diagnosis not present

## 2015-07-27 DIAGNOSIS — G20 Parkinson's disease: Secondary | ICD-10-CM | POA: Diagnosis not present

## 2015-07-27 DIAGNOSIS — G9340 Encephalopathy, unspecified: Secondary | ICD-10-CM | POA: Diagnosis not present

## 2015-08-27 DIAGNOSIS — I10 Essential (primary) hypertension: Secondary | ICD-10-CM | POA: Diagnosis not present

## 2015-08-27 DIAGNOSIS — Z1389 Encounter for screening for other disorder: Secondary | ICD-10-CM | POA: Diagnosis not present

## 2015-08-27 DIAGNOSIS — E1165 Type 2 diabetes mellitus with hyperglycemia: Secondary | ICD-10-CM | POA: Diagnosis not present

## 2015-08-27 DIAGNOSIS — G20 Parkinson's disease: Secondary | ICD-10-CM | POA: Diagnosis not present

## 2015-08-27 DIAGNOSIS — G9340 Encephalopathy, unspecified: Secondary | ICD-10-CM | POA: Diagnosis not present

## 2015-09-17 ENCOUNTER — Emergency Department (HOSPITAL_COMMUNITY)

## 2015-09-17 ENCOUNTER — Encounter (HOSPITAL_COMMUNITY): Payer: Self-pay | Admitting: *Deleted

## 2015-09-17 ENCOUNTER — Emergency Department (HOSPITAL_COMMUNITY)
Admission: EM | Admit: 2015-09-17 | Discharge: 2015-09-17 | Disposition: A | Discharge status: Home / Self Care | Attending: Emergency Medicine | Admitting: Emergency Medicine

## 2015-09-17 DIAGNOSIS — Z7984 Long term (current) use of oral hypoglycemic drugs: Secondary | ICD-10-CM | POA: Diagnosis not present

## 2015-09-17 DIAGNOSIS — Z79899 Other long term (current) drug therapy: Secondary | ICD-10-CM | POA: Insufficient documentation

## 2015-09-17 DIAGNOSIS — E785 Hyperlipidemia, unspecified: Secondary | ICD-10-CM | POA: Insufficient documentation

## 2015-09-17 DIAGNOSIS — R404 Transient alteration of awareness: Secondary | ICD-10-CM | POA: Diagnosis not present

## 2015-09-17 DIAGNOSIS — Z87891 Personal history of nicotine dependence: Secondary | ICD-10-CM | POA: Diagnosis not present

## 2015-09-17 DIAGNOSIS — N390 Urinary tract infection, site not specified: Secondary | ICD-10-CM | POA: Insufficient documentation

## 2015-09-17 DIAGNOSIS — E119 Type 2 diabetes mellitus without complications: Secondary | ICD-10-CM | POA: Diagnosis not present

## 2015-09-17 DIAGNOSIS — Z7982 Long term (current) use of aspirin: Secondary | ICD-10-CM | POA: Insufficient documentation

## 2015-09-17 DIAGNOSIS — F0390 Unspecified dementia without behavioral disturbance: Secondary | ICD-10-CM | POA: Insufficient documentation

## 2015-09-17 DIAGNOSIS — I10 Essential (primary) hypertension: Secondary | ICD-10-CM | POA: Diagnosis not present

## 2015-09-17 DIAGNOSIS — R4182 Altered mental status, unspecified: Secondary | ICD-10-CM | POA: Diagnosis not present

## 2015-09-17 HISTORY — DX: Hyperlipidemia, unspecified: E78.5

## 2015-09-17 LAB — CBC WITH DIFFERENTIAL/PLATELET
Basophils Absolute: 0 10*3/uL (ref 0.0–0.1)
Basophils Relative: 1 %
Eosinophils Absolute: 0.4 10*3/uL (ref 0.0–0.7)
Eosinophils Relative: 6 %
HEMATOCRIT: 35.6 % — AB (ref 39.0–52.0)
Hemoglobin: 11.6 g/dL — ABNORMAL LOW (ref 13.0–17.0)
LYMPHS PCT: 28 %
Lymphs Abs: 1.8 10*3/uL (ref 0.7–4.0)
MCH: 28.9 pg (ref 26.0–34.0)
MCHC: 32.6 g/dL (ref 30.0–36.0)
MCV: 88.6 fL (ref 78.0–100.0)
MONO ABS: 0.6 10*3/uL (ref 0.1–1.0)
MONOS PCT: 9 %
NEUTROS ABS: 3.7 10*3/uL (ref 1.7–7.7)
Neutrophils Relative %: 58 %
Platelets: 247 10*3/uL (ref 150–400)
RBC: 4.02 MIL/uL — ABNORMAL LOW (ref 4.22–5.81)
RDW: 16.1 % — AB (ref 11.5–15.5)
WBC: 6.4 10*3/uL (ref 4.0–10.5)

## 2015-09-17 LAB — URINE MICROSCOPIC-ADD ON: SQUAMOUS EPITHELIAL / LPF: NONE SEEN

## 2015-09-17 LAB — COMPREHENSIVE METABOLIC PANEL
ALT: 30 U/L (ref 17–63)
ANION GAP: 8 (ref 5–15)
AST: 29 U/L (ref 15–41)
Albumin: 3.5 g/dL (ref 3.5–5.0)
Alkaline Phosphatase: 53 U/L (ref 38–126)
BILIRUBIN TOTAL: 0.7 mg/dL (ref 0.3–1.2)
BUN: 25 mg/dL — ABNORMAL HIGH (ref 6–20)
CO2: 23 mmol/L (ref 22–32)
Calcium: 8.7 mg/dL — ABNORMAL LOW (ref 8.9–10.3)
Chloride: 112 mmol/L — ABNORMAL HIGH (ref 101–111)
Creatinine, Ser: 1.4 mg/dL — ABNORMAL HIGH (ref 0.61–1.24)
GFR calc Af Amer: 51 mL/min — ABNORMAL LOW (ref >60)
GFR, EST NON AFRICAN AMERICAN: 44 mL/min — AB (ref >60)
Glucose, Bld: 109 mg/dL — ABNORMAL HIGH (ref 65–99)
POTASSIUM: 3.8 mmol/L (ref 3.5–5.1)
Sodium: 143 mmol/L (ref 135–145)
TOTAL PROTEIN: 6.5 g/dL (ref 6.5–8.1)

## 2015-09-17 LAB — URINALYSIS, ROUTINE W REFLEX MICROSCOPIC
Bilirubin Urine: NEGATIVE
GLUCOSE, UA: NEGATIVE mg/dL
Ketones, ur: NEGATIVE mg/dL
Nitrite: POSITIVE — AB
PROTEIN: 30 mg/dL — AB
Specific Gravity, Urine: 1.02 (ref 1.005–1.030)
pH: 5.5 (ref 5.0–8.0)

## 2015-09-17 LAB — DIGOXIN LEVEL: DIGOXIN LVL: 0.2 ng/mL — AB (ref 0.8–2.0)

## 2015-09-17 MED ORDER — DEXTROSE 5 % IV SOLN
1.0000 g | Freq: Once | INTRAVENOUS | Status: AC
  Administered 2015-09-17: 1 g via INTRAVENOUS
  Filled 2015-09-17: qty 10

## 2015-09-17 MED ORDER — CEPHALEXIN 500 MG PO CAPS: 500.0000 mg | ORAL_CAPSULE | Freq: Three times a day (TID) | ORAL | Status: AC

## 2015-09-17 NOTE — ED Provider Notes (Signed)
CSN: [id number]     Arrival date & time 09/17/15  4098 History  By signing my name below, I, Marica Otter, attest that this documentation has been prepared under the direction and in the presence of No att. providers found. Electronically Signed: Marica Otter, ED Scribe. 09/17/2015. 10:41 AM.  LEVEL 5 CAVEAT: DEMENTIA   Chief Complaint  Patient presents with  . Altered Mental Status   The history is provided by the patient and a relative (son). No language interpreter was used.   PCP: Colette Ribas, MD HPI Comments: Richard Hays is a 82 y.o. male, with PMHx noted below including dementia (home hospice for dementia), HTN and DM, who presents to the Emergency Department accompanied by his son, complaining of worsening confusion onset 3 days ago. Associated Sx include visual hallucinations. Son reports current Sx are consistent with prior episodes of UTI; son notes that pt has been hospitalized 2-3 times for prior UTIs. Son denies any new meds or any recent falls. Pt denies headache, chest pain, abd pain, vomiting, or any other pain at this time. Son denies fevers at home.   Past Medical History  Diagnosis Date  . Dementia   . Diabetes mellitus without complication (HCC)   . Hypertension   . UTI (lower urinary tract infection)   . Dementia 05/07/2015  . Hyperlipidemia    History reviewed. No pertinent past surgical history. No family history on file. Social History  Substance Use Topics  . Smoking status: Former Games developer  . Smokeless tobacco: None  . Alcohol Use: No    Review of Systems  Unable to perform ROS: Dementia    Allergies  Sulfa antibiotics  Home Medications   Prior to Admission medications   Medication Sig Start Date End Date Taking? Authorizing Provider  acetaminophen-codeine (TYLENOL #3) 300-30 MG tablet Take 1 tablet by mouth every 6 (six) hours as needed for moderate pain.   Yes Historical Provider, MD  ALPRAZolam (XANAX) 0.25 MG tablet Take 0.25 mg by  mouth at bedtime.   Yes Historical Provider, MD  aspirin EC 325 MG tablet Take 325 mg by mouth every morning.   Yes Historical Provider, MD  atorvastatin (LIPITOR) 40 MG tablet Take 40 mg by mouth at bedtime. 02/18/14  Yes Historical Provider, MD  digoxin (LANOXIN) 0.125 MG tablet Take 0.0625 mg by mouth daily.    Yes Historical Provider, MD  donepezil (ARICEPT) 10 MG tablet Take 10 mg by mouth at bedtime. 02/18/14  Yes Historical Provider, MD  escitalopram (LEXAPRO) 10 MG tablet Take 5 mg by mouth at bedtime.  12/19/14  Yes Historical Provider, MD  feeding supplement, ENSURE ENLIVE, (ENSURE ENLIVE) LIQD Take 237 mLs by mouth 2 (two) times daily between meals. 05/10/15  Yes Standley Brooking, MD  finasteride (PROSCAR) 5 MG tablet Take 5 mg by mouth daily.   Yes Historical Provider, MD  furosemide (LASIX) 20 MG tablet Take 20 mg by mouth daily as needed for fluid.   Yes Historical Provider, MD  glipiZIDE-metformin (METAGLIP) 2.5-500 MG per tablet Take 0.5 tablets by mouth daily.    Yes Historical Provider, MD  guaiFENesin-codeine (ROBITUSSIN AC) 100-10 MG/5ML syrup Take 5 mLs by mouth 3 (three) times daily as needed for cough.   Yes Historical Provider, MD  lisinopril (PRINIVIL,ZESTRIL) 5 MG tablet Take 5 mg by mouth daily. 07/30/15  Yes Historical Provider, MD  tamsulosin (FLOMAX) 0.4 MG CAPS capsule Take 0.8 mg by mouth 2 (two) times daily.    Yes Historical  Provider, MD  trimethoprim (TRIMPEX) 100 MG tablet Take 100 mg by mouth daily. 07/30/15  Yes Historical Provider, MD  Vitamin D, Ergocalciferol, (DRISDOL) 50000 units CAPS capsule Take 50,000 Units by mouth every 7 (seven) days.   Yes Historical Provider, MD  cephALEXin (KEFLEX) 500 MG capsule Take 1 capsule (500 mg total) by mouth 3 (three) times daily. 09/17/15   Glynn Octave, MD  megestrol (MEGACE) 40 MG tablet Take 40 mg by mouth every other day. Reported on 09/17/2015    Historical Provider, MD   Triage Vitals: BP 118/69 mmHg  Pulse 95   Temp(Src) 97.5 F (36.4 C) (Oral)  Resp 18  Ht  (1.702 m)  Wt 140 lb (63.504 kg)  BMI 21.92 kg/m2  SpO2 100% Physical Exam  Constitutional: He appears well-developed and well-nourished. No distress.  Intermittent confusion  HENT:  Head: Normocephalic and atraumatic.  Mouth/Throat: Oropharynx is clear and moist. No oropharyngeal exudate.  Eyes: Conjunctivae and EOM are normal. Pupils are equal, round, and reactive to light.  Neck: Normal range of motion. Neck supple.  No meningismus.  Cardiovascular: Normal rate, regular rhythm, normal heart sounds and intact distal pulses.   No murmur heard. Pulmonary/Chest: Effort normal and breath sounds normal. No respiratory distress. He exhibits no tenderness.  Abdominal: Soft. There is no tenderness. There is no rebound and no guarding.  Musculoskeletal: Normal range of motion. He exhibits no edema or tenderness.  Neurological: He is alert. No cranial nerve deficit. He exhibits normal muscle tone. Coordination normal.  Moving all extremities, 5/5 strength. Cranial nerves 2-12 intact. Oriented to person and time.   Skin: Skin is warm.  Nursing note and vitals reviewed.   ED Course  Procedures (including critical care time) DIAGNOSTIC STUDIES: Oxygen Saturation is 100% on ra, nl by my interpretation.    COORDINATION OF CARE: 10:22 AM: Discussed treatment plan which includes labs with pt and son at bedside; they verbalize understanding and agrees with treatment plan.  Labs Review Labs Reviewed  URINALYSIS, ROUTINE W REFLEX MICROSCOPIC (NOT AT [facility]) - Abnormal; Notable for the following:    Hgb urine dipstick SMALL (*)    Protein, ur 30 (*)    Nitrite POSITIVE (*)    Leukocytes, UA MODERATE (*)    All other components within normal limits  URINE MICROSCOPIC-ADD ON - Abnormal; Notable for the following:    Bacteria, UA MANY (*)    All other components within normal limits  CBC WITH DIFFERENTIAL/PLATELET - Abnormal; Notable for the  following:    RBC 4.02 (*)    Hemoglobin 11.6 (*)    HCT 35.6 (*)    RDW 16.1 (*)    All other components within normal limits  COMPREHENSIVE METABOLIC PANEL - Abnormal; Notable for the following:    Chloride 112 (*)    Glucose, Bld 109 (*)    BUN 25 (*)    Creatinine, Ser 1.40 (*)    Calcium 8.7 (*)    GFR calc non Af Amer 44 (*)    GFR calc Af Amer 51 (*)    All other components within normal limits  DIGOXIN LEVEL - Abnormal; Notable for the following:    Digoxin Level 0.2 (*)    All other components within normal limits  URINE CULTURE   I have personally reviewed and evaluated these lab results as part of my medical decision-making.   MDM   Final diagnoses:  Urinary tract infection without hematuria, site unspecified  Altered mental status,  unspecified altered mental status type   Dementia patient from home with increased confusion and "talking out of his head" for the past 2 days. Similar symptoms when he has a urinary tract infection. No fever or vomiting. No recent medication changes.  UA consistent with infection.  Labs at baseline. Chest x-ray negative.  Patient is oriented 2 with intermittent episodes of confusion. Son is worried about taking care of him at home. Discussed with case Proofreader. They already have care arranged through hospice of [facility].  Discussed with son that admission to the hospital will not add much to his father's care and may in fact make him worse. He is able to take antibiotics at home. Patient's daughter is in agreement with discharge home and patient wants to go home.  Family in agreement with patient discharge.  Follow up with PCP this week. Return precautions discussed.   I personally performed the services described in this documentation, which was scribed in my presence. The recorded information has been reviewed and is accurate.    Glynn Octave, MD 09/17/15 [phone]

## 2015-09-17 NOTE — ED Notes (Signed)
Report received, pt enroute to radiology

## 2015-09-17 NOTE — ED Notes (Signed)
Son states that his father is drinking a lot of soda as well as fruit juices. Pt family educated regarding excess sugar and fruit juice content of sugar and diabetic sugar correlations.

## 2015-09-17 NOTE — ED Notes (Signed)
Pt son states that his father is increasingly confused and his mother cannot care for him. MD informed.

## 2015-09-17 NOTE — ED Notes (Signed)
Pt son states that strong odor to urine with hx of past UTIs. Pt does have dementia but son states "When he starts talking out of his head, its usually an infection." Increased confusion first noticed late Saturday.

## 2015-09-17 NOTE — ED Notes (Signed)
Pt drinking water- IV infusion

## 2015-09-17 NOTE — ED Notes (Signed)
Per son, pt rarely ambulates. He is transported by wheelchair and by stretcher - MD informed

## 2015-09-17 NOTE — Discharge Instructions (Signed)
Urinary Tract Infection °Follow up with your doctor this week. Return to the  ED if you develop new or worsening symptoms. °Urinary tract infections (UTIs) can develop anywhere along your urinary tract. Your urinary tract is your body's drainage system for removing wastes and extra water. Your urinary tract includes two kidneys, two ureters, a bladder, and a urethra. Your kidneys are a pair of bean-shaped organs. Each kidney is about the size of your fist. They are located below your ribs, one on each side of your spine. °CAUSES °Infections are caused by microbes, which are microscopic organisms, including fungi, viruses, and bacteria. These organisms are so small that they can only be seen through a microscope. Bacteria are the microbes that most commonly cause UTIs. °SYMPTOMS  °Symptoms of UTIs may vary by age and gender of the patient and by the location of the infection. Symptoms in young women typically include a frequent and intense urge to urinate and a painful, burning feeling in the bladder or urethra during urination. Older women and men are more likely to be tired, shaky, and weak and have muscle aches and abdominal pain. A fever may mean the infection is in your kidneys. Other symptoms of a kidney infection include pain in your back or sides below the ribs, nausea, and vomiting. °DIAGNOSIS °To diagnose a UTI, your caregiver will ask you about your symptoms. Your caregiver will also ask you to provide a urine sample. The urine sample will be tested for bacteria and white blood cells. White blood cells are made by your body to help fight infection. °TREATMENT  °Typically, UTIs can be treated with medication. Because most UTIs are caused by a bacterial infection, they usually can be treated with the use of antibiotics. The choice of antibiotic and length of treatment depend on your symptoms and the type of bacteria causing your infection. °HOME CARE INSTRUCTIONS °· If you were prescribed antibiotics, take  them exactly as your caregiver instructs you. Finish the medication even if you feel better after you have only taken some of the medication. °· Drink enough water and fluids to keep your urine clear or pale yellow. °· Avoid caffeine, tea, and carbonated beverages. They tend to irritate your bladder. °· Empty your bladder often. Avoid holding urine for long periods of time. °· Empty your bladder before and after sexual intercourse. °· After a bowel movement, women should cleanse from front to back. Use each tissue only once. °SEEK MEDICAL CARE IF:  °· You have back pain. °· You develop a fever. °· Your symptoms do not begin to resolve within 3 days. °SEEK IMMEDIATE MEDICAL CARE IF:  °· You have severe back pain or lower abdominal pain. °· You develop chills. °· You have nausea or vomiting. °· You have continued burning or discomfort with urination. °MAKE SURE YOU:  °· Understand these instructions. °· Will watch your condition. °· Will get help right away if you are not doing well or get worse. °  °This information is not intended to replace advice given to you by your health care provider. Make sure you discuss any questions you have with your health care provider. °  °Document Released: 04/23/2005 Document Revised: 04/04/2015 Document Reviewed: 08/22/2011 °Elsevier Interactive Patient Education ©2016 [employer] ° °

## 2015-09-18 LAB — URINE CULTURE

## 2015-12-27 DEATH — deceased

## 2016-08-01 IMAGING — CT CT HEAD W/O CM
1 series · 16 of 30 positions shown, 20 images · non-contrast
Comparison: None.

CLINICAL DATA: Incontinence for 1 day, confusion

EXAM:
CT HEAD WITHOUT CONTRAST
TECHNIQUE: Contiguous axial images were obtained from the base of the skull
through the vertex without intravenous contrast.

[Series 2: headseq 4.8 h37s · axial · 0.43mm/px · z∈[+1139,+1289]mm · 16 of 36 slices shown, 20 images]
[im 2/36  brain]
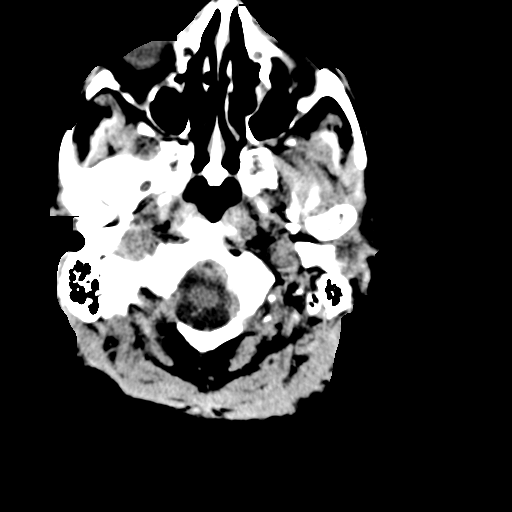
[im 2/36  bone]
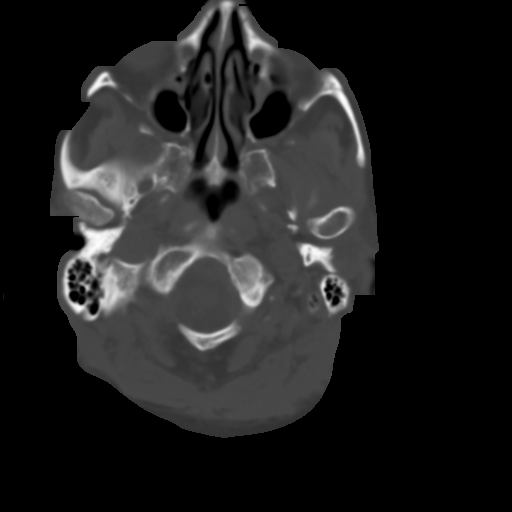
[im 4/36  brain]
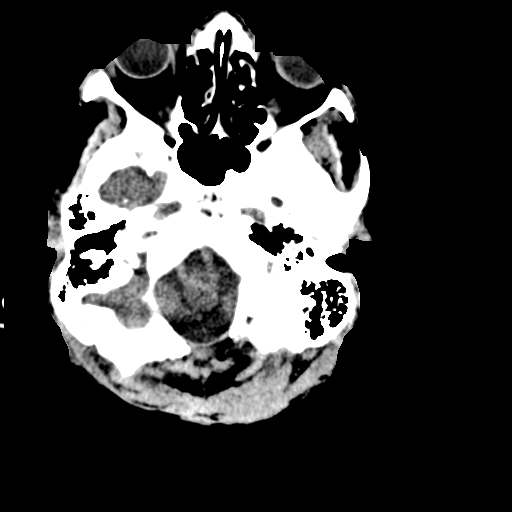
[im 7/36  brain]
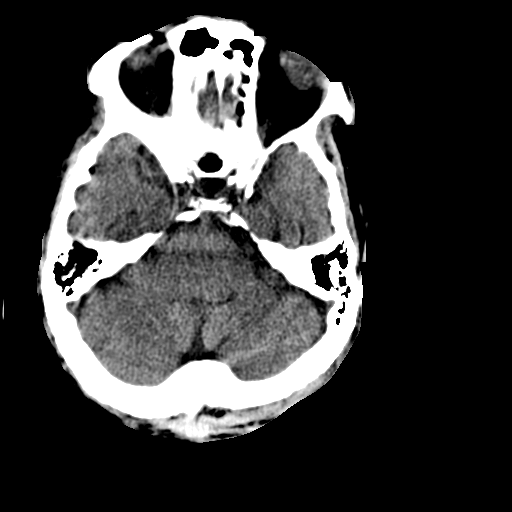
[im 8/36  brain]
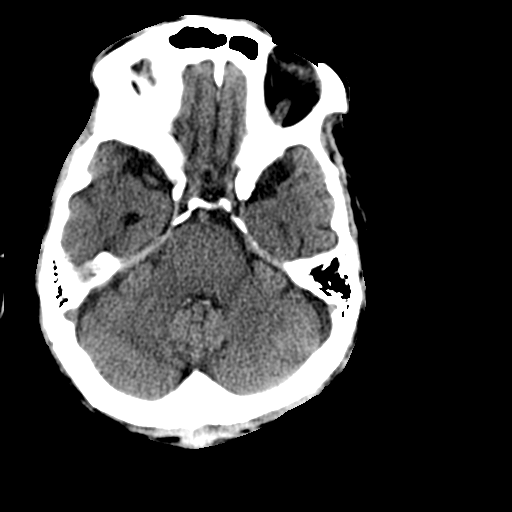
[im 10/36  brain]
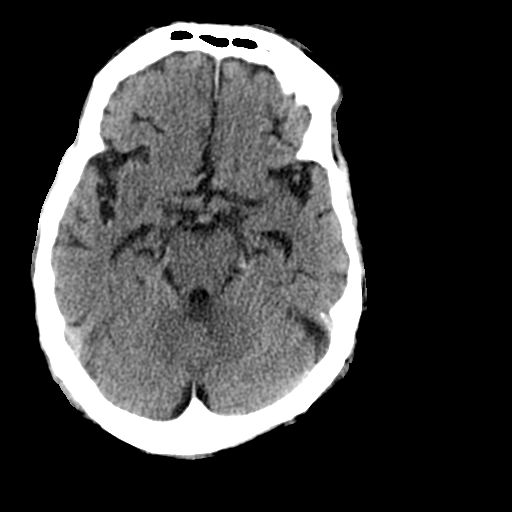
[im 10/36  bone]
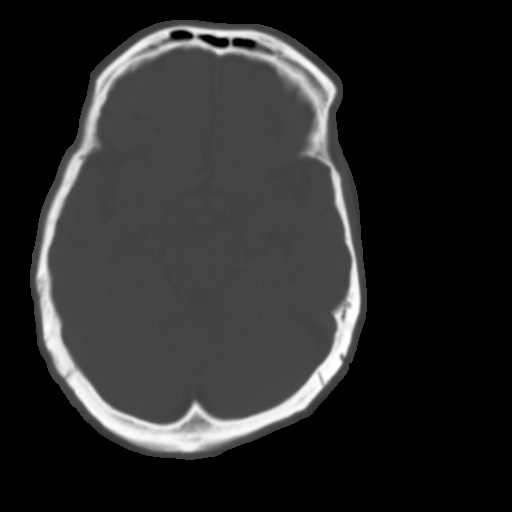
[im 11/36  brain]
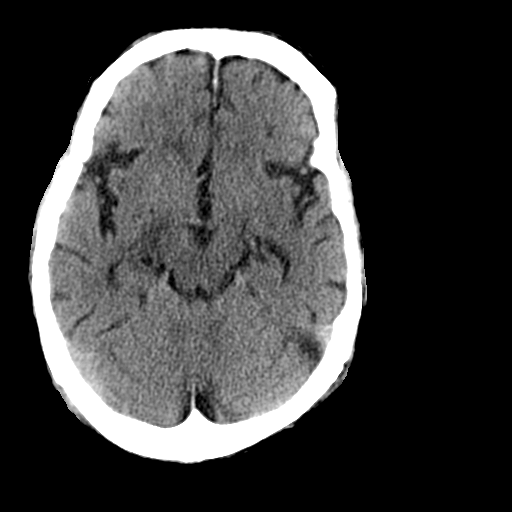
[im 14/36  brain]
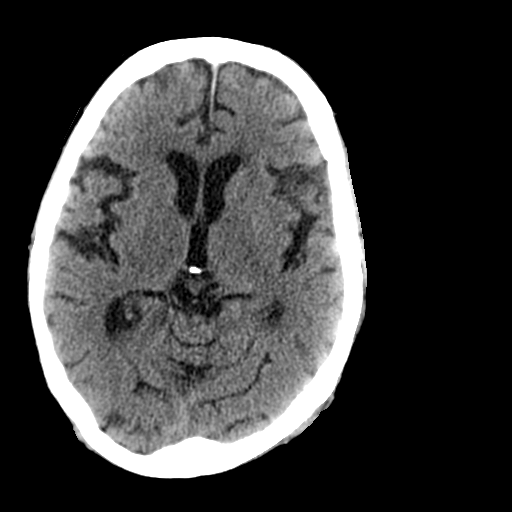
[im 16/36  brain]
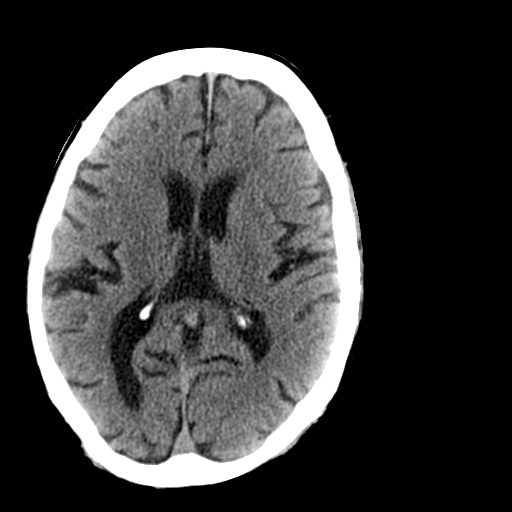
[im 17/36  brain]
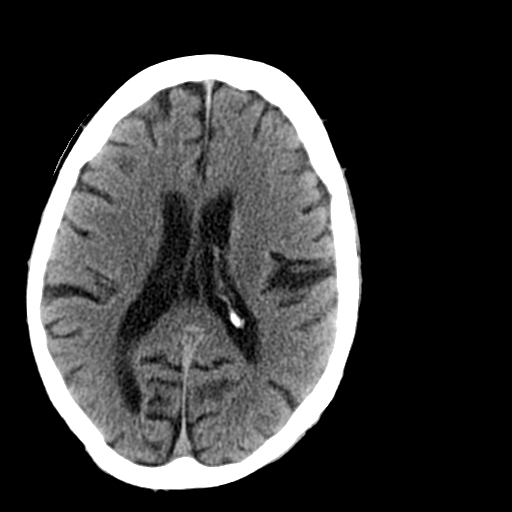
[im 17/36  bone]
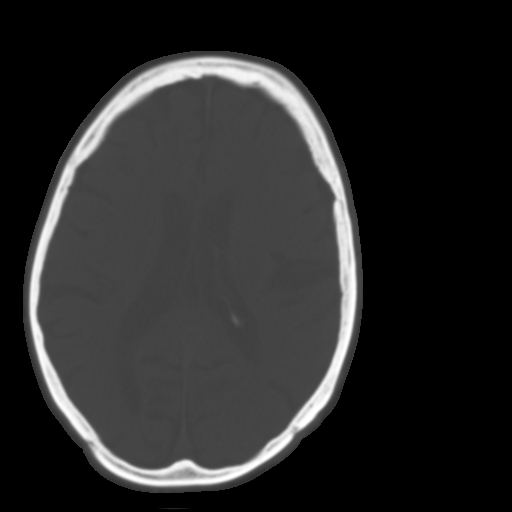
[im 20/36  brain]
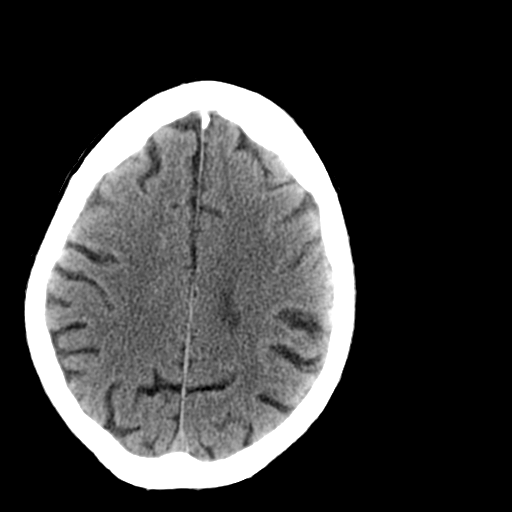
[im 22/36  brain]
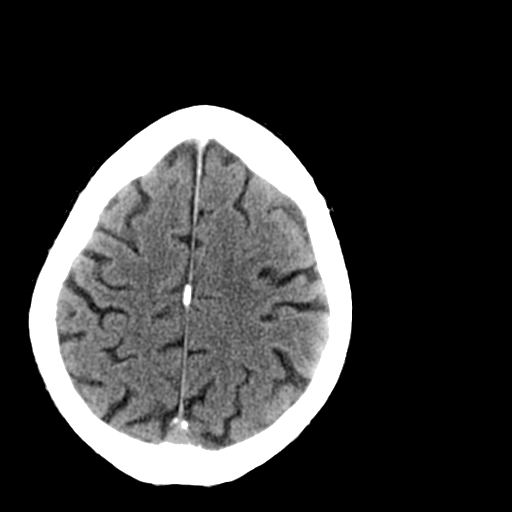
[im 23/36  brain]
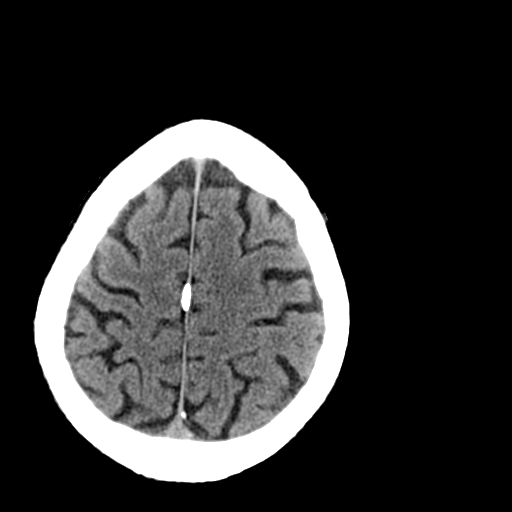
[im 26/36  brain]
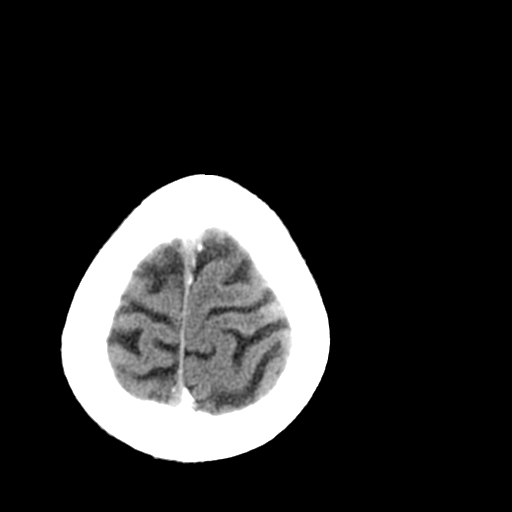
[im 26/36  bone]
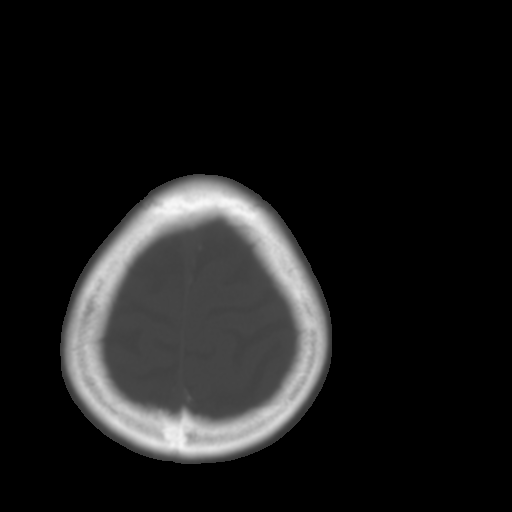
[im 27/36  brain]
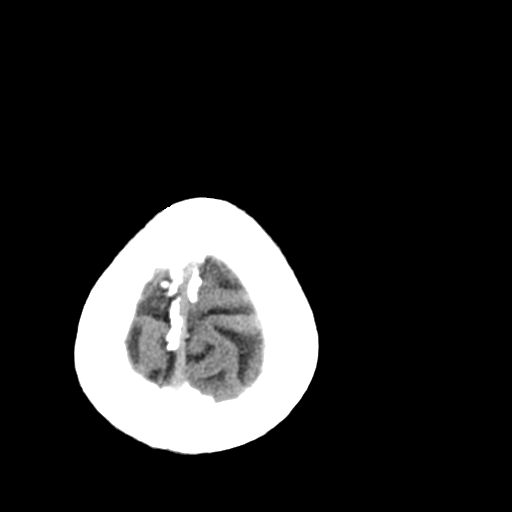
[im 29/36  brain]
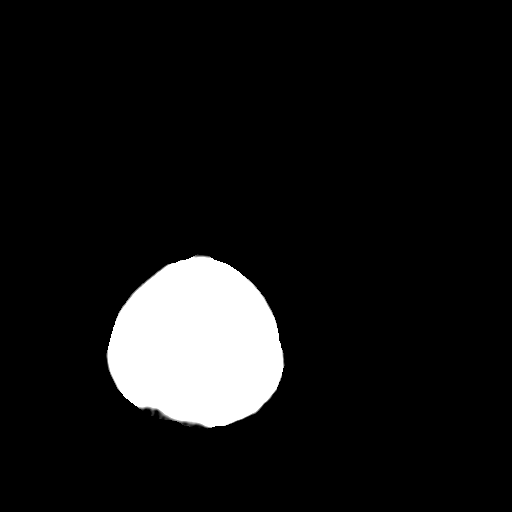
[im 32/36  brain]
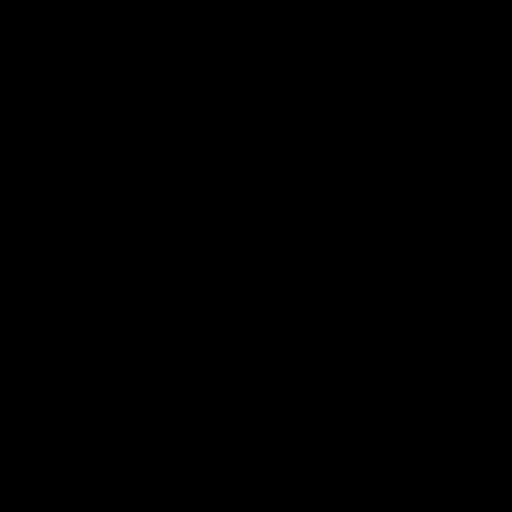

[16 of 30 positions shown; findings below may reference images not displayed]

FINDINGS: Moderate diffuse age-related atrophy. Mild low attenuation in the
deep white matter. No evidence of focal abnormality to suggest mass
hemorrhage infarct or extra-axial fluid. No hydrocephalus. Calvarium
is intact.
IMPRESSION: No acute findings.

## 2017-05-06 IMAGING — CT CT HEAD W/O CM
1 series · 15 of 30 positions shown, 19 images · non-contrast
Comparison: CT of the head performed 11/22/2014

CLINICAL DATA: Acute onset of generalized weakness. Multiple recent
falls. Difficulty walking. Initial encounter.

EXAM:
CT HEAD WITHOUT CONTRAST
TECHNIQUE: Contiguous axial images were obtained from the base of the skull
through the vertex without intravenous contrast.

[Series 2: headseq 4.8 h37s · axial · 0.47mm/px · z∈[+74,+235]mm · 15 of 36 slices shown, 19 images]
[im 2/36  brain]
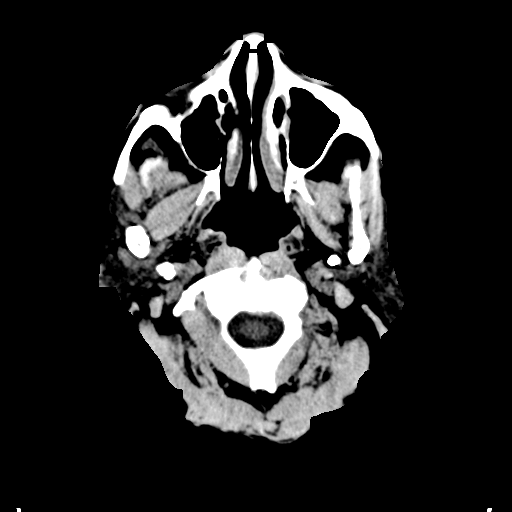
[im 2/36  bone]
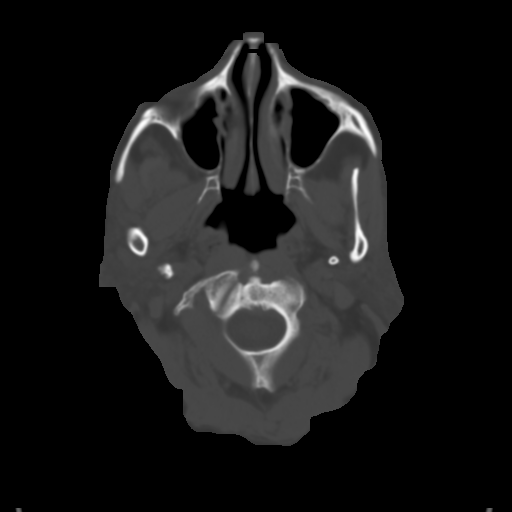
[im 4/36  brain]
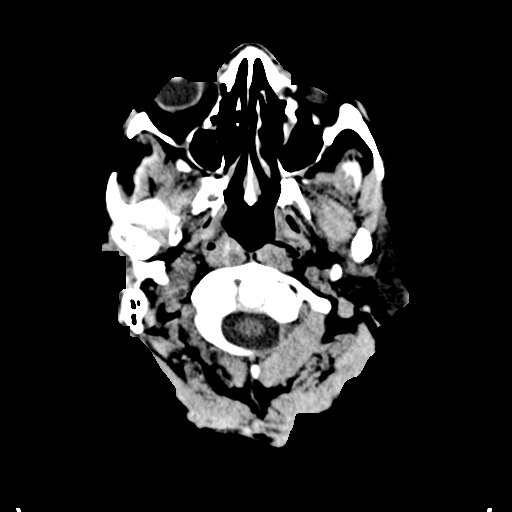
[im 7/36  brain]
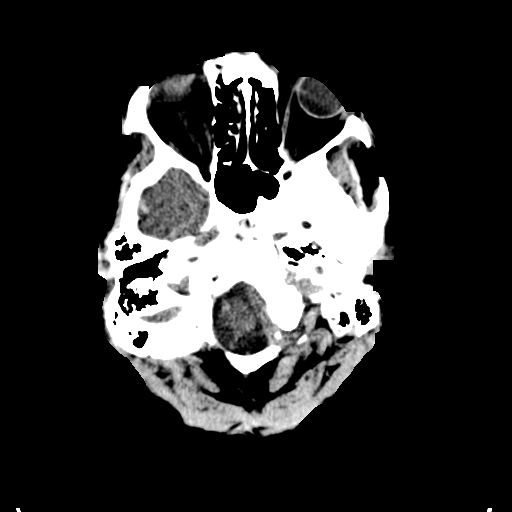
[im 9/36  brain]
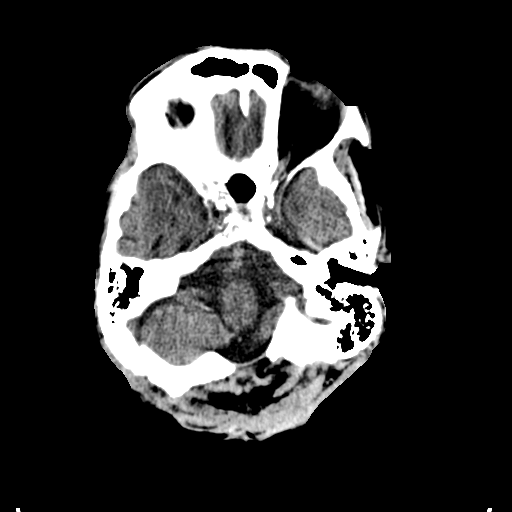
[im 11/36  brain]
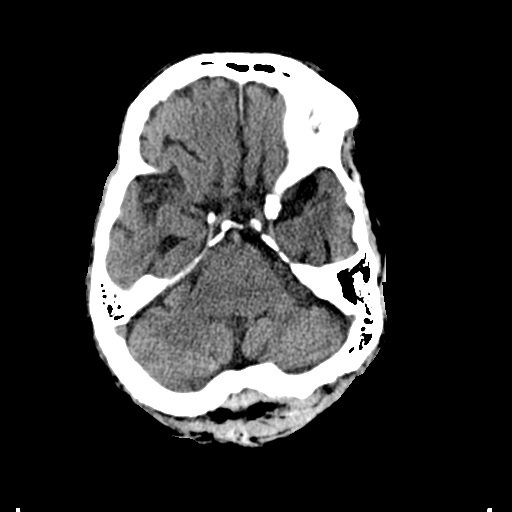
[im 11/36  bone]
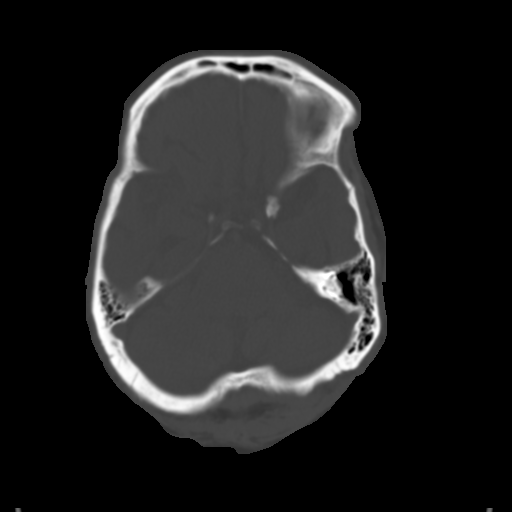
[im 14/36  brain]
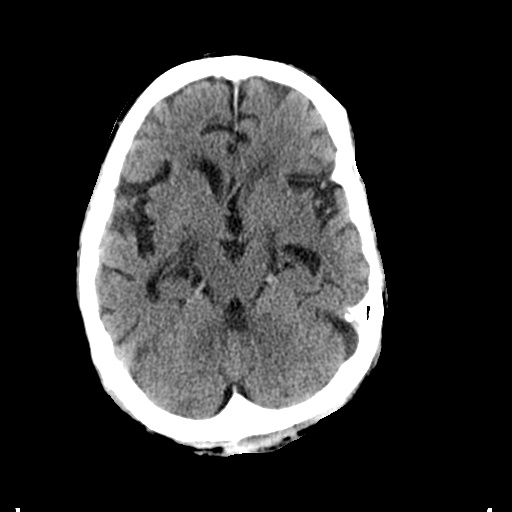
[im 16/36  brain]
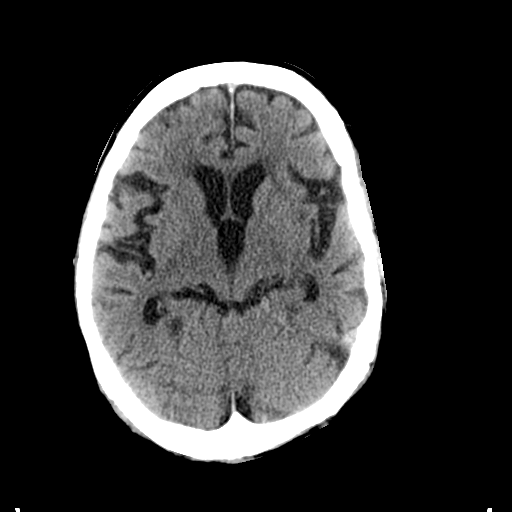
[im 19/36  brain]
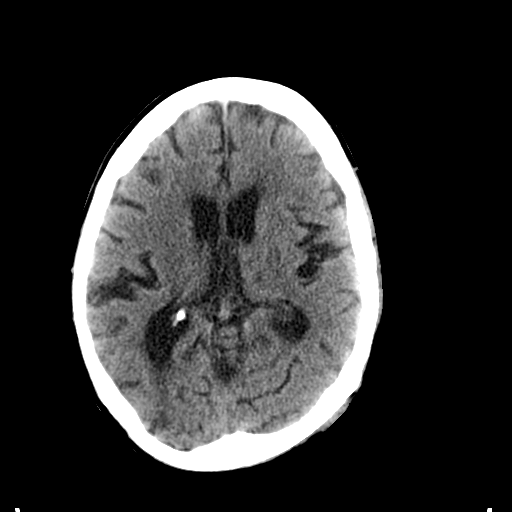
[im 20/36  brain]
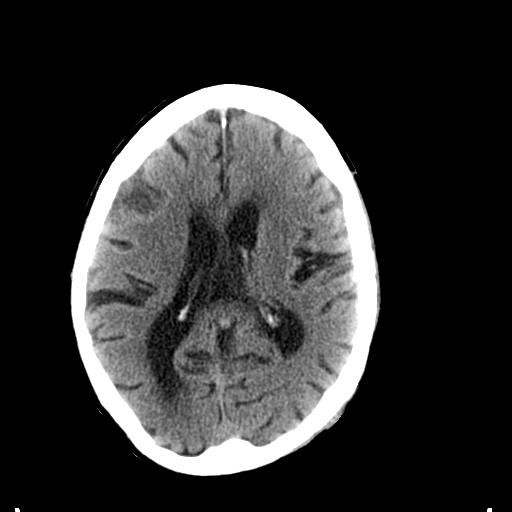
[im 20/36  bone]
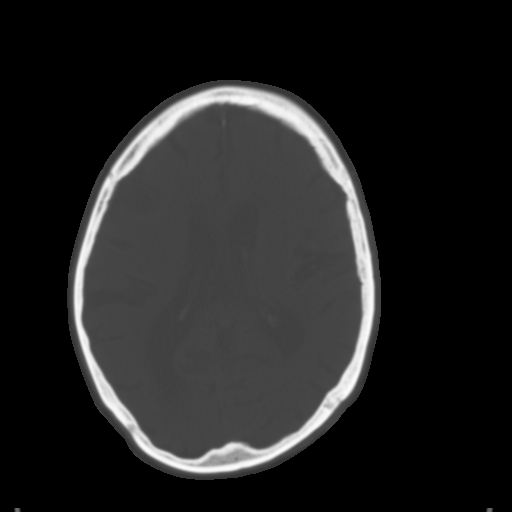
[im 22/36  brain]
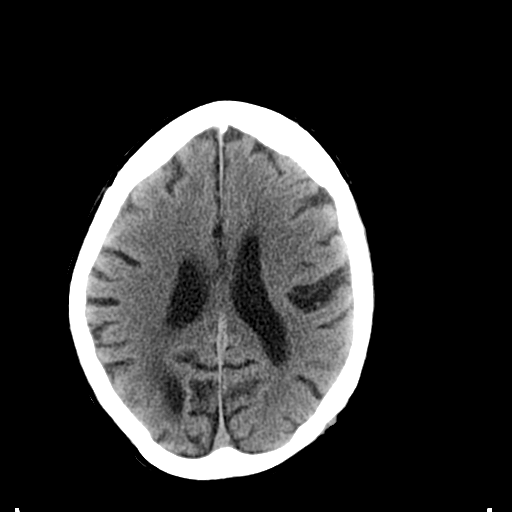
[im 25/36  brain]
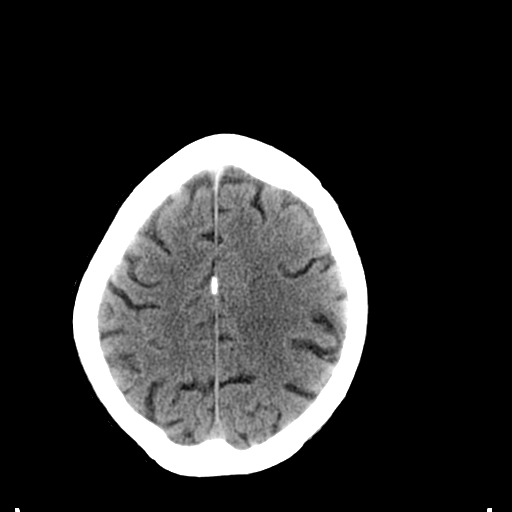
[im 27/36  brain]
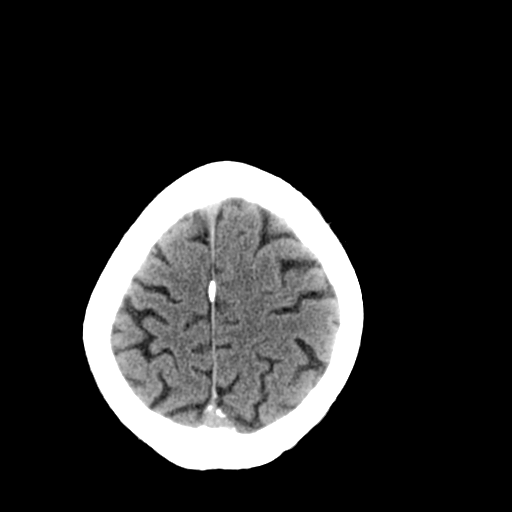
[im 29/36  brain]
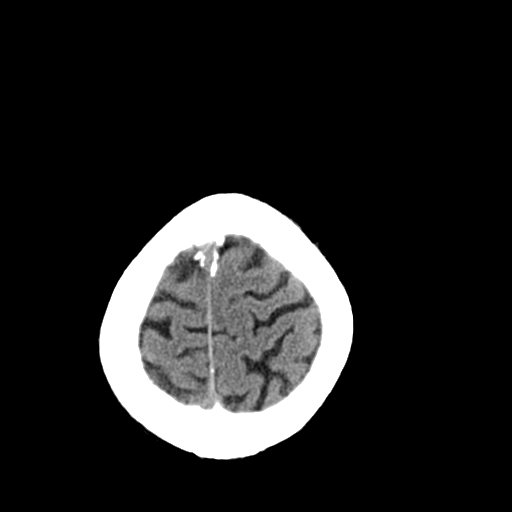
[im 29/36  bone]
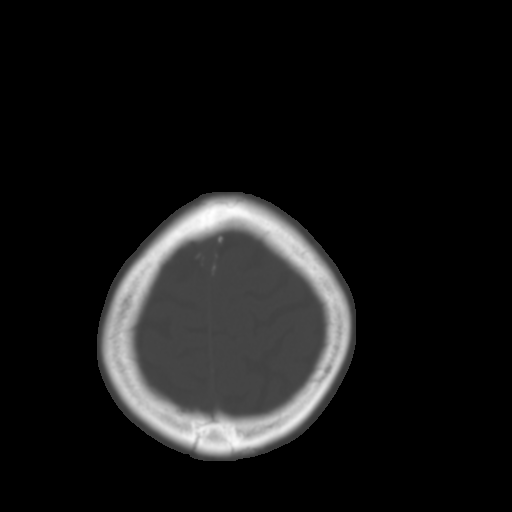
[im 32/36  brain]
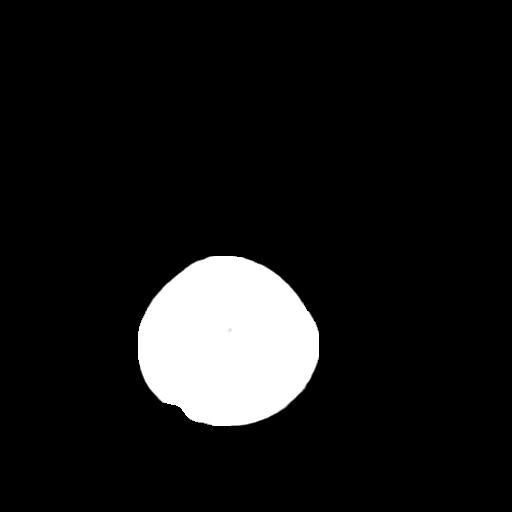
[im 34/36  brain]
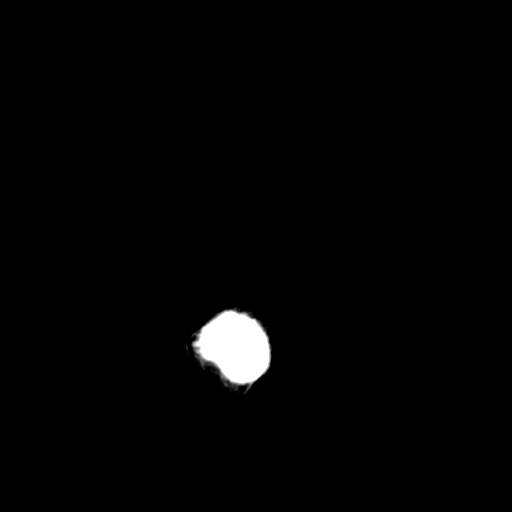

[15 of 30 positions shown; findings below may reference images not displayed]

FINDINGS: There is no evidence of acute infarction, mass lesion, or intra- or
extra-axial hemorrhage on CT.

Prominence of the ventricles and sulci reflects mild to moderate
cortical volume loss. Mild cerebellar atrophy is noted. Scattered
periventricular white matter change likely reflects small vessel
ischemic microangiopathy.

The brainstem and fourth ventricle are within normal limits. The
basal ganglia are unremarkable in appearance. The cerebral
hemispheres demonstrate grossly normal gray-white differentiation.
No mass effect or midline shift is seen.

There is no evidence of fracture; visualized osseous structures are
unremarkable in appearance. The orbits are within normal limits. The
paranasal sinuses and mastoid air cells are well-aerated. No
significant soft tissue abnormalities are seen.
IMPRESSION: 1. No evidence of traumatic intracranial injury or fracture.
2. Mild to moderate cortical volume loss and scattered small vessel
ischemic microangiopathy.
